# Patient Record
Sex: Male | Born: 1995 | Race: Black or African American | Hispanic: No | Marital: Single | Smoking: Current every day smoker
Health system: Southern US, Community
[De-identification: ages and names within clinical notes are randomized; demographics above are authoritative.]

## PROBLEM LIST (undated history)

## (undated) DIAGNOSIS — G40909 Epilepsy, unspecified, not intractable, without status epilepticus: Secondary | ICD-10-CM

## (undated) DIAGNOSIS — F909 Attention-deficit hyperactivity disorder, unspecified type: Secondary | ICD-10-CM

## (undated) DIAGNOSIS — G250 Essential tremor: Secondary | ICD-10-CM

## (undated) DIAGNOSIS — F32A Depression, unspecified: Secondary | ICD-10-CM

## (undated) DIAGNOSIS — F840 Autistic disorder: Secondary | ICD-10-CM

## (undated) DIAGNOSIS — R6250 Unspecified lack of expected normal physiological development in childhood: Secondary | ICD-10-CM

## (undated) DIAGNOSIS — F17200 Nicotine dependence, unspecified, uncomplicated: Secondary | ICD-10-CM

## (undated) DIAGNOSIS — J8410 Pulmonary fibrosis, unspecified: Secondary | ICD-10-CM

## (undated) DIAGNOSIS — R569 Unspecified convulsions: Secondary | ICD-10-CM

## (undated) DIAGNOSIS — F919 Conduct disorder, unspecified: Secondary | ICD-10-CM

## (undated) DIAGNOSIS — R4689 Other symptoms and signs involving appearance and behavior: Secondary | ICD-10-CM

## (undated) DIAGNOSIS — F329 Major depressive disorder, single episode, unspecified: Secondary | ICD-10-CM

## (undated) HISTORY — DX: Epilepsy, unspecified, not intractable, without status epilepticus: G40.909

## (undated) HISTORY — DX: Attention-deficit hyperactivity disorder, unspecified type: F90.9

## (undated) HISTORY — DX: Unspecified lack of expected normal physiological development in childhood: R62.50

## (undated) HISTORY — PX: NO PAST SURGERIES: SHX2092

## (undated) HISTORY — DX: Essential tremor: G25.0

## (undated) HISTORY — DX: Other symptoms and signs involving appearance and behavior: R46.89

## (undated) HISTORY — DX: Autistic disorder: F84.0

## (undated) HISTORY — DX: Conduct disorder, unspecified: F91.9

## (undated) HISTORY — DX: Unspecified convulsions: R56.9

## (undated) HISTORY — DX: Nicotine dependence, unspecified, uncomplicated: F17.200

## (undated) HISTORY — DX: Pulmonary fibrosis, unspecified: J84.10

---
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
## 2014-01-01 ENCOUNTER — Encounter (HOSPITAL_COMMUNITY): Payer: Self-pay | Admitting: Registered Nurse

## 2014-01-01 DIAGNOSIS — F913 Oppositional defiant disorder: Secondary | ICD-10-CM

## 2014-01-01 DIAGNOSIS — R45850 Homicidal ideations: Secondary | ICD-10-CM

## 2014-01-01 MED ORDER — CARBAMAZEPINE 200 MG PO TABS
300.0000 mg | ORAL_TABLET | Freq: Two times a day (BID) | ORAL | Status: DC
  Administered 2014-01-01 – 2014-01-02 (×3): 300 mg via ORAL
  Filled 2014-01-01 (×4): qty 1.5

## 2014-01-01 MED ORDER — ARIPIPRAZOLE 10 MG PO TABS
10.0000 mg | ORAL_TABLET | Freq: Every day | ORAL | Status: DC
  Administered 2014-01-01 – 2014-01-02 (×2): 10 mg via ORAL
  Filled 2014-01-01 (×2): qty 1

## 2014-01-01 MED ORDER — SERTRALINE HCL 50 MG PO TABS
100.0000 mg | ORAL_TABLET | Freq: Every day | ORAL | Status: DC
  Administered 2014-01-01 – 2014-01-02 (×2): 100 mg via ORAL
  Filled 2014-01-01 (×2): qty 2
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
## 2014-01-01 NOTE — BH Assessment (Signed)
Per Charter CommunicationsShuvon pt is to remain in the ER overnight for observation for possible d/c plans in the morning.  Glorious PeachNajah Ilynn Stauffer, MS, LCASA Assessment Counselor

## 2014-01-01 NOTE — ED Notes (Signed)
Pt resting in room, in no acute distress 

## 2014-01-01 NOTE — Progress Notes (Signed)
Spoke with EDP, Kohut about home meds/ ED meds

## 2014-01-02 ENCOUNTER — Encounter (HOSPITAL_COMMUNITY): Payer: Self-pay | Admitting: Registered Nurse

## 2014-01-02 NOTE — BHH Suicide Risk Assessment (Cosign Needed)
Suicide Risk Assessment  Discharge Assessment     Demographic Factors:  Male  Total Time spent with patient: 30 minutes Psychiatric Specialty Exam:      Blood pressure 119/58, pulse 68, temperature 98 F (36.7 C), temperature source Oral, resp. rate 18, height 6\' 2"  (1.88 m), weight 76.658 kg (169 lb), SpO2 100.00%.Body mass index is 21.69 kg/(m^2).   General Appearance: Disheveled   Eye SolicitorContact:: None   Speech: Clear and Coherent and Normal Rate   Volume: Decreased   Mood: Irritable   Affect: Flat   Thought Process: Circumstantial   Orientation: Full (Time, Place, and Person)   Thought Content: "She took my stuff"   Suicidal Thoughts: No   Homicidal Thoughts: No   Memory: Immediate; Fair  Recent; Fair  Remote; Fair   Judgement: Poor   Insight: Lacking and Shallow   Psychomotor Activity: Decreased   Concentration: Fair   Recall: Eastman KodakFair   Fund of Knowledge:Fair   Language: Fair   Akathisia: No   Handed: Right   AIMS (if indicated):   Assets: Desire for Improvement  Housing  Social Support   Sleep:   Musculoskeletal:  Strength & Muscle Tone: within normal limits  Gait & Station: normal  Patient leans: N/A    Mental Status Per Nursing Assessment::   On Admission:     Current Mental Status by Physician: Patient denies suicidal/homicidal ideation, psychosis, and paranoia  Loss Factors: NA  Historical Factors: NA  Risk Reduction Factors:   Positive social support  Continued Clinical Symptoms:  Previous Psychiatric Diagnoses and Treatments  Cognitive Features That Contribute To Risk:  Closed-mindedness    Suicide Risk:  Minimal: No identifiable suicidal ideation.  Patients presenting with no risk factors but with morbid ruminations; may be classified as minimal risk based on the severity of the depressive symptoms  Discharge Diagnoses:  AXIS I: Oppositional Defiant Disorder  AXIS II: Deferred  AXIS III:  Past Medical History   Diagnosis  Date   .   Seizures    .  Aggressive behavior    .  Conduct disorder    .  ADHD (attention deficit hyperactivity disorder)    .  Delay in development    .  Depression     AXIS IV: other psychosocial or environmental problems  AXIS V: 51-60 moderate symptoms   Plan Of Care/Follow-up recommendations:  Activity:  Resume usual activity Diet:  Resume usual diet  Is patient on multiple antipsychotic therapies at discharge:  No   Has Patient had three or more failed trials of antipsychotic monotherapy by history:  No  Recommended Plan for Multiple Antipsychotic Therapies: NA    Rankin, Shuvon, FNP-BC 01/02/2014, 3:46 PM

## 2014-01-02 NOTE — Discharge Instructions (Signed)
Aggression Physically aggressive behavior is common among small children. When frustrated or angry, toddlers may act out. Often, they will push, bite, or hit. Most children show less physical aggression as they grow up. Their language and interpersonal skills improve, too. But continued aggressive behavior is a sign of a problem. This behavior can lead to aggression and delinquency in adolescence and adulthood. Aggressive behavior can be psychological or physical. Forms of psychological aggression include threatening or bullying others. Forms of physical aggression include:  Pushing.  Hitting.  Slapping.  Kicking.  Stabbing.  Shooting.  Raping. PREVENTION  Encouraging the following behaviors can help manage aggression:  Respecting others and valuing differences.  Participating in school and community functions, including sports, music, after-school programs, community groups, and volunteer work.  Talking with an adult when they are sad, depressed, fearful, anxious, or angry. Discussions with a parent or other family member, Veterinary surgeon, Runner, broadcasting/film/video, or coach can help.  Avoiding alcohol and drug use.  Dealing with disagreements without aggression, such as conflict resolution. To learn this, children need parents and caregivers to model respectful communication and problem solving.  Limiting exposure to aggression and violence, such as video games that are not age appropriate, violence in the media, or domestic violence. Document Released: 04/05/2007 Document Revised: 08/31/2011 Document Reviewed: 08/14/2010 [facility] Patient Information 2015 [place], [place]. This information is not intended to replace advice given to you by your health care provider. Make sure you discuss any questions you have with your health care provider.  Conduct Disorder Conduct disorder is a chronic, repeated pattern of behavior that violates basic rights of others or societal rules.  CAUSES A clear cause for  conduct disorder has not been determined. However, there are both genetic and environmental risk factors for the development of conduct disorder, such as having a parent with antisocial personality disorder or alcohol dependence.  SYMPTOMS Symptoms of conduct disorder most often begin between middle childhood and middle adolescence and occur in multiple settings. Symptoms include:   Aggression toward people or animals.  Bullying, threatening, or intimidating behavior.  Starting physical fights or aggressive reactions to others.  Use of a weapon that can cause serious physical harm to others.  Destruction of property.  Unlawful entry into another's car or home.  Lying.  Stealing.  Running away from home for lengthy periods.  Skipping school. DIAGNOSIS Conduct disorder is diagnosed by the following:  Exam of the child alone and with a parent or caregiver.  Interview with the parents or caregiver alone.  Review of school reports.  A physical exam. Document Released: 09/23/2010 Document Revised: 08/31/2011 Document Reviewed: 09/23/2010 [facility] Patient Information 2015 [place], [place]. This information is not intended to replace advice given to you by your health care provider. Make sure you discuss any questions you have with your health care provider.  Oppositional Defiant Disorder  Oppositional defiant disorder (ODD) is a pattern of negative, defiant, and hostile behavior toward authority figures and often includes a tendency to bother and irritate others on purpose. Periods of oppositional behavior are common during preschool years and adolescence. Oppositional defiant disorder only can be diagnosed if these behaviors persist and cause significant impairment in social or academic functioning. Problems often begin in children before they reach the age of 8 years. Problem behaviors often start at home, but over time these behaviors may appear in other settings. There is often a  vicious cycle between a child's difficult temperament (hard to sooth, intense emotional reactions) and the parents' frustrated, negative or  harsh reactions. Oppositional defiant disorder tends to run in families. It also is more common when parents are experiencing marital problems. SYMPTOMS Symptoms of ODD include negative, hostile and defiant behavior that lasts at least 6 months. During these 6 months, 4 or more of the following behaviors are present:   Loss of temper.  Argumentative behavior toward adults.  Active refusal of adults' requests or rules.  Deliberately annoys people.  Refusal to accept blame for his or her mistakes or misbehavior.  Easily annoyed by others.  Angry and resentful.  Spiteful and vindictive behavior. DIAGNOSIS Oppositional defiant disorder is diagnosed in the same way as many other psychiatric disorders in children. This is done by:  Examining the child.  Talking with the child.  Talking to the parents.  Thoroughly reviewing the medical history. It is also common in the children with ODD to have other psychiatric problems.   Document Released: 11/28/2001 Document Revised: 08/31/2011 Document Reviewed: 09/29/2010 [facility] Patient Information 2015 [place]. This information is not intended to replace advice given to you by your health care provider. Make sure you discuss any questions you have with your health care provider.

## 2014-01-02 NOTE — Progress Notes (Signed)
CSW spoke with the patient's mother and explained the aftercare plan and she is in agreement.  Mother reports that she is on her way to pick the patient up for discharge.       Geoffrey [name], MSW, SpauldingLCSWA, 01/02/2014 Evening Clinical Social Worker [phone]

## 2014-01-02 NOTE — ED Notes (Signed)
Pt mother here to pick patient up for discharge; already spoke to CSW and MD; paper provided to mother; belongings, two bags, sent to patient; pt stable at time of discharge
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
## 2014-01-02 NOTE — ED Notes (Signed)
MD at bedside. 

## 2014-01-02 NOTE — Progress Notes (Signed)
CSW spoke with Olevia PerchesMegan Johnson [phone]) with [facility] to confirm upcoming appointments.  She suggested the CSW to contact the office to speak with Deonna at [phone].  CSW spoke with Samuel GermanyDeonna the patient next appointment is 01/04/14@11 :30am. CSW called [facility] to schedule appointment but was only able leave a message.      Maryelizabeth Rowanressa Floriene Jeschke, MSW, [place]CSWA, 01/02/2014 Evening Clinical Social Worker [phone]

## 2014-01-03 NOTE — ED Provider Notes (Signed)
Medical screening examination/treatment/procedure(s) were conducted as a shared visit with non-physician practitioner(s) and myself.  I personally evaluated the patient during the encounter.   EKG Interpretation None       Doug SouSam Yani Coventry, MD 01/03/14 [phone]

## 2014-01-25 ENCOUNTER — Encounter (HOSPITAL_COMMUNITY): Payer: Self-pay | Admitting: Emergency Medicine

## 2014-01-25 ENCOUNTER — Emergency Department (HOSPITAL_COMMUNITY)
Admission: EM | Admit: 2014-01-25 | Discharge: 2014-01-25 | Disposition: A | Discharge status: Home / Self Care | Payer: Medicaid Other | Attending: Emergency Medicine | Admitting: Emergency Medicine

## 2014-01-25 DIAGNOSIS — F4310 Post-traumatic stress disorder, unspecified: Secondary | ICD-10-CM

## 2014-01-25 DIAGNOSIS — F17200 Nicotine dependence, unspecified, uncomplicated: Secondary | ICD-10-CM | POA: Diagnosis not present

## 2014-01-25 DIAGNOSIS — F329 Major depressive disorder, single episode, unspecified: Secondary | ICD-10-CM | POA: Insufficient documentation

## 2014-01-25 DIAGNOSIS — F919 Conduct disorder, unspecified: Secondary | ICD-10-CM

## 2014-01-25 DIAGNOSIS — F911 Conduct disorder, childhood-onset type: Secondary | ICD-10-CM | POA: Insufficient documentation

## 2014-01-25 DIAGNOSIS — R45850 Homicidal ideations: Secondary | ICD-10-CM | POA: Insufficient documentation

## 2014-01-25 DIAGNOSIS — F988 Other specified behavioral and emotional disorders with onset usually occurring in childhood and adolescence: Secondary | ICD-10-CM | POA: Insufficient documentation

## 2014-01-25 DIAGNOSIS — R454 Irritability and anger: Secondary | ICD-10-CM

## 2014-01-25 DIAGNOSIS — G40909 Epilepsy, unspecified, not intractable, without status epilepticus: Secondary | ICD-10-CM | POA: Diagnosis not present

## 2014-01-25 DIAGNOSIS — F3289 Other specified depressive episodes: Secondary | ICD-10-CM | POA: Insufficient documentation

## 2014-01-25 DIAGNOSIS — Z79899 Other long term (current) drug therapy: Secondary | ICD-10-CM | POA: Insufficient documentation

## 2014-01-25 DIAGNOSIS — F913 Oppositional defiant disorder: Secondary | ICD-10-CM | POA: Diagnosis not present

## 2014-01-25 LAB — COMPREHENSIVE METABOLIC PANEL
ALT: 18 U/L (ref 0–53)
AST: 23 U/L (ref 0–37)
Albumin: 3.9 g/dL (ref 3.5–5.2)
Alkaline Phosphatase: 94 U/L (ref 39–117)
Anion gap: 10 (ref 5–15)
BUN: 14 mg/dL (ref 6–23)
CO2: 27 meq/L (ref 19–32)
Calcium: 9.6 mg/dL (ref 8.4–10.5)
Chloride: 105 mEq/L (ref 96–112)
Creatinine, Ser: 1.07 mg/dL (ref 0.50–1.35)
GFR calc Af Amer: >90 mL/min (ref >90)
GFR calc non Af Amer: >90 mL/min (ref >90)
Glucose, Bld: 100 mg/dL — ABNORMAL HIGH (ref 70–99)
POTASSIUM: 4 meq/L (ref 3.7–5.3)
SODIUM: 142 meq/L (ref 137–147)
TOTAL PROTEIN: 7.6 g/dL (ref 6.0–8.3)
Total Bilirubin: 0.3 mg/dL (ref 0.3–1.2)

## 2014-01-25 LAB — CBC
HEMATOCRIT: 38.9 % — AB (ref 39.0–52.0)
HEMOGLOBIN: 13.3 g/dL (ref 13.0–17.0)
MCH: 30.9 pg (ref 26.0–34.0)
MCHC: 34.2 g/dL (ref 30.0–36.0)
MCV: 90.5 fL (ref 78.0–100.0)
Platelets: 160 10*3/uL (ref 150–400)
RBC: 4.3 MIL/uL (ref 4.22–5.81)
RDW: 12.7 % (ref 11.5–15.5)
WBC: 4.1 10*3/uL (ref 4.0–10.5)

## 2014-01-25 LAB — ETHANOL: Alcohol, Ethyl (B): <11 mg/dL (ref 0–11)

## 2014-01-25 LAB — SALICYLATE LEVEL: Salicylate Lvl: <2 mg/dL — ABNORMAL LOW (ref 2.8–20.0)

## 2014-01-25 LAB — ACETAMINOPHEN LEVEL

## 2014-01-25 MED ORDER — ONDANSETRON HCL 4 MG PO TABS: 4.0000 mg | ORAL_TABLET | Freq: Three times a day (TID) | ORAL | Status: DC | PRN

## 2014-01-25 MED ORDER — ACETAMINOPHEN 325 MG PO TABS: 650.0000 mg | ORAL_TABLET | ORAL | Status: DC | PRN

## 2014-01-25 MED ORDER — ZOLPIDEM TARTRATE 5 MG PO TABS: 5.0000 mg | ORAL_TABLET | Freq: Every evening | ORAL | Status: DC | PRN

## 2014-01-25 MED ORDER — IBUPROFEN 200 MG PO TABS: 600.0000 mg | ORAL_TABLET | Freq: Three times a day (TID) | ORAL | Status: DC | PRN

## 2014-01-25 MED ORDER — LORAZEPAM 1 MG PO TABS
1.0000 mg | ORAL_TABLET | Freq: Three times a day (TID) | ORAL | Status: DC | PRN
Start: 2014-01-25 — End: 2014-01-25

## 2014-01-25 MED ORDER — NICOTINE 21 MG/24HR TD PT24: 21.0000 mg | MEDICATED_PATCH | Freq: Every day | TRANSDERMAL | Status: DC

## 2014-01-25 MED ORDER — ALUM & MAG HYDROXIDE-SIMETH 200-200-20 MG/5ML PO SUSP: 30.0000 mL | ORAL | Status: DC | PRN

## 2014-01-25 NOTE — ED Notes (Signed)
Patient has one bag of belongings in locker 32. 

## 2014-01-25 NOTE — ED Notes (Addendum)
Pt's mother, Feliciana RossettiRoshanna Williams [phone]), called to ask when she would be coming to pick him up.  Pt's mother states that we need to keep him and she is talking to his crisis team to be able to force us to keep him.  I told her that pt has already been discharged and that we would not be keeping him.  Mother states that she will be here to pick him up soon.

## 2014-01-25 NOTE — ED Notes (Signed)
Mom has arrived to bedside and requesting to speak to psych MD.  States patient is not going home.  Called [employer].  Notified to tell Mom, Dr. Ladona Ridgelaylor has left for the day.  Notified Charge RN to evaluate for next step.

## 2014-01-25 NOTE — Consult Note (Signed)
  Mother of patient was refusing to take patient home.  Met with mother and discussed patient behavioral issues.  Mother states that patient refuses to listen, gets up set when he is told to do his chores.  "I have been trying to get him in a group home and [facility] tells me that they are still waiting on information from the [place] worker called [facility] and got information for mother.  Mother states that an appointment was set for some one to come out to house.  Mother also given information and resources for group homes.  Informed mother that patient was not suicidal, homicidal, psychotic, or paranoid to the point where he would hurt him self or anyone else.  Informed that we could not send him to hospital for behavioral problems.  Mother did voice her agreement.     Johnice Riebe B. Josede Cicero FNP-BC
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
## 2014-01-25 NOTE — ED Notes (Signed)
Notified family of pt d/c.  Pt mother had concerns and requested to speak to MD.  Pt call transferred to Dr. Ladona Ridgelaylor.
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
## 2014-01-25 NOTE — Discharge Instructions (Signed)
Aggression Physically aggressive behavior is common among small children. When frustrated or angry, toddlers may act out. Often, they will push, bite, or hit. Most children show less physical aggression as they grow up. Their language and interpersonal skills improve, too. But continued aggressive behavior is a sign of a problem. This behavior can lead to aggression and delinquency in adolescence and adulthood. Aggressive behavior can be psychological or physical. Forms of psychological aggression include threatening or bullying others. Forms of physical aggression include:  Pushing.  Hitting.  Slapping.  Kicking.  Stabbing.  Shooting.  Raping. PREVENTION  Encouraging the following behaviors can help manage aggression:  Respecting others and valuing differences.  Participating in school and community functions, including sports, music, after-school programs, community groups, and volunteer work.  Talking with an adult when they are sad, depressed, fearful, anxious, or angry. Discussions with a parent or other family member, Veterinary surgeoncounselor, Runner, broadcasting/film/videoteacher, or coach can help.  Avoiding alcohol and drug use.  Dealing with disagreements without aggression, such as conflict resolution. To learn this, children need parents and caregivers to model respectful communication and problem solving.  Limiting exposure to aggression and violence, such as video games that are not age appropriate, violence in the media, or domestic violence. Document Released: 04/05/2007 Document Revised: 08/31/2011 Document Reviewed: 08/14/2010 [facility] Patient Information 2015 [employer], [place]. This information is not intended to replace advice given to you by your health care provider. Make sure you discuss any questions you have with your health care provider.  Conduct Disorder Conduct disorder is a chronic, repeated pattern of behavior that violates basic rights of others or societal rules.  CAUSES A clear cause for  conduct disorder has not been determined. However, there are both genetic and environmental risk factors for the development of conduct disorder, such as having a parent with antisocial personality disorder or alcohol dependence.  SYMPTOMS Symptoms of conduct disorder most often begin between middle childhood and middle adolescence and occur in multiple settings. Symptoms include:   Aggression toward people or animals.  Bullying, threatening, or intimidating behavior.  Starting physical fights or aggressive reactions to others.  Use of a weapon that can cause serious physical harm to others.  Destruction of property.  Unlawful entry into another's car or home.  Lying.  Stealing.  Running away from home for lengthy periods.  Skipping school. DIAGNOSIS Conduct disorder is diagnosed by the following:  Exam of the child alone and with a parent or caregiver.  Interview with the parents or caregiver alone.  Review of school reports.  A physical exam. Document Released: 09/23/2010 Document Revised: 08/31/2011 Document Reviewed: 09/23/2010 [facility] Patient Information 2015 [place], [place]. This information is not intended to replace advice given to you by your health care provider. Make sure you discuss any questions you have with your health care provider.

## 2014-01-25 NOTE — [facility] Suicide Risk Assessment (Cosign Needed)
Suicide Risk Assessment  Discharge Assessment     Demographic Factors:  Male  Total Time spent with patient: 20 minutes  Psychiatric Specialty Exam:     Blood pressure 98/60, pulse 78, temperature 98.5 F (36.9 C), temperature source Oral, resp. rate 16, SpO2 100.00%.There is no weight on file to calculate BMI.   General Appearance: Casual   Eye Contact:: Good   Speech: Clear and Coherent and Normal Rate   Volume: Normal   Mood: Good"   Affect: Appropriate and Congruent   Thought Process: Circumstantial   Orientation: Full (Time, Place, and Person)   Thought Content: "I just didn't want him in my room; I didn't hit him"   Suicidal Thoughts: No   Homicidal Thoughts: No   Memory: Immediate; Good  Recent; Good  Remote; Good   Judgement: Fair   Insight: Present   Psychomotor Activity: Normal   Concentration: Fair   Recall: Good   Fund of Knowledge:Fair   Language: Fair   Akathisia: No   Handed: Right   AIMS (if indicated):   Assets: Communication Skills  Desire for Improvement  Housing  Social Support   Sleep:   Musculoskeletal:  Strength & Muscle Tone: within normal limits  Gait & Station: normal  Patient leans: N/A  Mental Status Per Nursing Assessment::   On Admission:     Current Mental Status by Physician: Patient denies suicidal/homicidal ideation, psychosis, and paranoia  Loss Factors: NA  Historical Factors: NA  Risk Reduction Factors:   Living with another person, especially a relative and Positive social support  Continued Clinical Symptoms:  Previous Psychiatric Diagnoses and Treatments  Cognitive Features That Contribute To Risk:  Loss of executive function    Suicide Risk:  Minimal: No identifiable suicidal ideation.  Patients presenting with no risk factors but with morbid ruminations; may be classified as minimal risk based on the severity of the depressive symptoms  Discharge Diagnoses:   AXIS I: ADHD, hyperactive type, Conduct  Disorder, Oppositional Defiant Disorder and Post Traumatic Stress Disorder  AXIS II: Deferred  AXIS III:  Past Medical History   Diagnosis  Date   .  Seizures    .  Aggressive behavior    .  Conduct disorder    .  ADHD (attention deficit hyperactivity disorder)    .  Delay in development    .  Depression     AXIS IV: other psychosocial or environmental problems  AXIS V: 61-70 mild symptoms     Plan Of Care/Follow-up recommendations:  Activity:  As tolerated Diet:  As tolerated  Is patient on multiple antipsychotic therapies at discharge:  No   Has Patient had three or more failed trials of antipsychotic monotherapy by history:  No  Recommended Plan for Multiple Antipsychotic Therapies: NA    Areya Lemmerman, FNP-BC 01/25/2014, 2:16 PM
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
## 2014-01-25 NOTE — Consult Note (Signed)
Face to face evaluation and I agree with this note 

## 2014-01-26 NOTE — ED Provider Notes (Signed)
Medical screening examination/treatment/procedure(s) were performed by non-physician practitioner and as supervising physician I was immediately available for consultation/collaboration.   EKG Interpretation None         Rolland PorterMark Elynn Patteson, MD 01/26/14 [phone]

## 2014-01-30 NOTE — Consult Note (Signed)
Face to face evaluation and I agree with this note 

## 2014-10-01 ENCOUNTER — Encounter (HOSPITAL_COMMUNITY): Payer: Self-pay | Admitting: *Deleted

## 2014-10-01 ENCOUNTER — Emergency Department (HOSPITAL_COMMUNITY)
Admission: EM | Admit: 2014-10-01 | Discharge: 2014-10-05 | Disposition: A | Discharge status: Home / Self Care | Payer: Medicaid Other | Attending: Emergency Medicine | Admitting: Emergency Medicine

## 2014-10-01 DIAGNOSIS — F988 Other specified behavioral and emotional disorders with onset usually occurring in childhood and adolescence: Secondary | ICD-10-CM | POA: Diagnosis present

## 2014-10-01 DIAGNOSIS — F329 Major depressive disorder, single episode, unspecified: Secondary | ICD-10-CM | POA: Insufficient documentation

## 2014-10-01 DIAGNOSIS — F840 Autistic disorder: Secondary | ICD-10-CM | POA: Diagnosis present

## 2014-10-01 DIAGNOSIS — F919 Conduct disorder, unspecified: Secondary | ICD-10-CM | POA: Insufficient documentation

## 2014-10-01 DIAGNOSIS — F6089 Other specific personality disorders: Secondary | ICD-10-CM | POA: Diagnosis not present

## 2014-10-01 DIAGNOSIS — Z79899 Other long term (current) drug therapy: Secondary | ICD-10-CM | POA: Diagnosis not present

## 2014-10-01 DIAGNOSIS — Z046 Encounter for general psychiatric examination, requested by authority: Secondary | ICD-10-CM | POA: Diagnosis present

## 2014-10-01 DIAGNOSIS — G40909 Epilepsy, unspecified, not intractable, without status epilepticus: Secondary | ICD-10-CM | POA: Diagnosis not present

## 2014-10-01 DIAGNOSIS — Z720 Tobacco use: Secondary | ICD-10-CM | POA: Insufficient documentation

## 2014-10-01 DIAGNOSIS — R4689 Other symptoms and signs involving appearance and behavior: Secondary | ICD-10-CM

## 2014-10-01 DIAGNOSIS — F4310 Post-traumatic stress disorder, unspecified: Secondary | ICD-10-CM | POA: Diagnosis present

## 2014-10-01 DIAGNOSIS — R454 Irritability and anger: Secondary | ICD-10-CM | POA: Diagnosis present

## 2014-10-01 LAB — COMPREHENSIVE METABOLIC PANEL
ALBUMIN: 4.4 g/dL (ref 3.5–5.2)
ALT: 17 U/L (ref 0–53)
ANION GAP: 8 (ref 5–15)
AST: 33 U/L (ref 0–37)
Alkaline Phosphatase: 67 U/L (ref 39–117)
BUN: 23 mg/dL (ref 6–23)
CO2: 25 mmol/L (ref 19–32)
Calcium: 9.1 mg/dL (ref 8.4–10.5)
Chloride: 106 mmol/L (ref 96–112)
Creatinine, Ser: 0.97 mg/dL (ref 0.50–1.35)
GFR calc Af Amer: >90 mL/min (ref >90)
Glucose, Bld: 93 mg/dL (ref 70–99)
Potassium: 4.4 mmol/L (ref 3.5–5.1)
Sodium: 139 mmol/L (ref 135–145)
Total Bilirubin: 0.6 mg/dL (ref 0.3–1.2)
Total Protein: 8.4 g/dL — ABNORMAL HIGH (ref 6.0–8.3)

## 2014-10-01 LAB — ACETAMINOPHEN LEVEL: Acetaminophen (Tylenol), Serum: <10 ug/mL — ABNORMAL LOW (ref 10–30)

## 2014-10-01 LAB — CBC
HEMATOCRIT: 42.2 % (ref 39.0–52.0)
HEMOGLOBIN: 14.5 g/dL (ref 13.0–17.0)
MCH: 31.1 pg (ref 26.0–34.0)
MCHC: 34.4 g/dL (ref 30.0–36.0)
MCV: 90.6 fL (ref 78.0–100.0)
PLATELETS: 236 10*3/uL (ref 150–400)
RBC: 4.66 MIL/uL (ref 4.22–5.81)
RDW: 13 % (ref 11.5–15.5)
WBC: 7.3 10*3/uL (ref 4.0–10.5)

## 2014-10-01 LAB — SALICYLATE LEVEL: Salicylate Lvl: <4 mg/dL (ref 2.8–20.0)

## 2014-10-01 LAB — ETHANOL: Alcohol, Ethyl (B): <5 mg/dL (ref 0–9)
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
## 2014-10-02 DIAGNOSIS — F840 Autistic disorder: Secondary | ICD-10-CM | POA: Diagnosis present

## 2014-10-02 DIAGNOSIS — F6089 Other specific personality disorders: Secondary | ICD-10-CM

## 2014-10-02 DIAGNOSIS — R4689 Other symptoms and signs involving appearance and behavior: Secondary | ICD-10-CM | POA: Insufficient documentation

## 2014-10-02 LAB — RAPID URINE DRUG SCREEN, HOSP PERFORMED
Amphetamines: NOT DETECTED
BARBITURATES: NOT DETECTED
BENZODIAZEPINES: NOT DETECTED
COCAINE: NOT DETECTED
Opiates: NOT DETECTED
TETRAHYDROCANNABINOL: NOT DETECTED

## 2014-10-02 MED ORDER — NICOTINE 21 MG/24HR TD PT24
21.0000 mg | MEDICATED_PATCH | Freq: Once | TRANSDERMAL | Status: DC
Start: 2014-10-02 — End: 2014-10-05

## 2014-10-02 MED ORDER — ARIPIPRAZOLE 10 MG PO TABS
10.0000 mg | ORAL_TABLET | Freq: Every day | ORAL | Status: DC
  Administered 2014-10-02 – 2014-10-05 (×4): 10 mg via ORAL
  Filled 2014-10-02 (×5): qty 1

## 2014-10-02 MED ORDER — ACETAMINOPHEN 325 MG PO TABS: 650.0000 mg | ORAL_TABLET | ORAL | Status: DC | PRN

## 2014-10-02 MED ORDER — IBUPROFEN 200 MG PO TABS: 600.0000 mg | ORAL_TABLET | Freq: Three times a day (TID) | ORAL | Status: DC | PRN

## 2014-10-02 MED ORDER — LORAZEPAM 1 MG PO TABS: 1.0000 mg | ORAL_TABLET | Freq: Four times a day (QID) | ORAL | Status: DC | PRN

## 2014-10-02 MED ORDER — CARBAMAZEPINE 200 MG PO TABS
300.0000 mg | ORAL_TABLET | Freq: Two times a day (BID) | ORAL | Status: DC
Start: 2014-10-02 — End: 2014-10-05
  Administered 2014-10-02 – 2014-10-04 (×6): 300 mg via ORAL
  Administered 2014-10-05: 10:00:00 via ORAL
  Filled 2014-10-02 (×9): qty 1.5

## 2014-10-02 MED ORDER — SERTRALINE HCL 50 MG PO TABS
100.0000 mg | ORAL_TABLET | Freq: Every day | ORAL | Status: DC
  Administered 2014-10-02 – 2014-10-05 (×4): 100 mg via ORAL
  Filled 2014-10-02 (×4): qty 2

## 2014-10-02 MED ORDER — ONDANSETRON HCL 4 MG PO TABS: 4.0000 mg | ORAL_TABLET | Freq: Three times a day (TID) | ORAL | Status: DC | PRN

## 2014-10-02 MED ORDER — CLONIDINE HCL 0.1 MG PO TABS
0.1000 mg | ORAL_TABLET | Freq: Two times a day (BID) | ORAL | Status: DC
  Administered 2014-10-02 – 2014-10-05 (×6): 0.1 mg via ORAL
  Filled 2014-10-02 (×6): qty 1

## 2014-10-02 NOTE — ED Notes (Signed)
Calm and cooperative.  Acuity low. 

## 2014-10-02 NOTE — Progress Notes (Signed)
Per chart review, pt mother is guardian. [facility] [employer] in the past. Per chart review from visit in 06/2013, patient IEP has patient in school until the age of 19. Pt has history of violent outburst. Pt mother was interested in group home placement in the past, but then decided to have pt remain in the home. [facility] and [facility], transition team. CSW to follow up with pt mother/guardian to obtain further collateral and other psychosocial/environmental factors for this admission.   Olga CoasterKristen Drayden Lukas, LCSW  Clinical Social Work  [facility] [phone]
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
## 2014-10-02 NOTE — ED Notes (Signed)
[facility] paper documentation in shadow chart.  Patient cal and cooperative.  Acuity low-moderate due to MR.
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
## 2014-10-02 NOTE — BH Assessment (Addendum)
Assessment completed. Attempted to contact petitioner Geoffrey [name]([name]-pt's mother) to gather collateral information; however petitioner did not answer and voice message was left for a return phone call. Consulted Donell SievertSpencer Simon, PA-C who recommended that pt be evaluated by psychiatry in the am for possible discharge. Dr. Blinda LeatherwoodPollina has been informed of the recommendation.

## 2014-10-02 NOTE — Progress Notes (Addendum)
Pt mother stated that Psychiatrist from [facility] coming to [facility] in efforts to assist with patient in level IV. Per mother, pt psychiatrist, to complete an updated psychological.   Olga CoasterKristen Demontrae Gilbert, LCSW  Clinical Social Work  [facility] [phone]

## 2014-10-02 NOTE — Progress Notes (Addendum)
TTS Counselor faxed over support documents for this pt to [facility]. This information is also reflected on the shift report.

## 2014-10-03 NOTE — ED Notes (Signed)
Patient is flat. Minimal communication. In bed covered. Denies SI, HI, AVH. Denies any anxiety or depression.   Encouragement offered. Snack provided.  Q 15 safety checks in place.

## 2014-10-03 NOTE — ED Notes (Signed)
Patient resting in bed. Denies SI, HI, AVH. Denies feelings of anxiety and depression. Refuses to have vitals taken at this time.   Encouragement offered.  Q 15 safety checks continue.
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
## 2014-10-04 DIAGNOSIS — F6089 Other specific personality disorders: Secondary | ICD-10-CM | POA: Diagnosis not present

## 2014-10-04 NOTE — ED Notes (Signed)
Acuity low. 
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
## 2014-10-04 NOTE — Progress Notes (Signed)
Pt evaluated by psychologist from [facility], and expressed that patient has declined. Patient mother states that patient is needing placement. Pt care coordinator Mikey CollegeKelvin is actively working on placement for patient in [facility] however it is not going to be readily available. Pt continues to present calm and cooperative. Pt does not meet criteria for [facility] diversion. [facility] does not have any beds available and respite is not available. Due to patient age and lack of innovation funding options are limited. CSW and pt mother discussed that if patient continues to remain out of crisis without thoughts of hurting self or others pt may be discharged home or to a level III group home. Pt mother requested time to discuss with pt step dad regarding home vs group home placement and does not wish for any calls to be made to group homes until tomorrow. Discussed with NP who is in agreement with plan to follow up with mother tomorrow regarding disposition.    Olga CoasterKristen Anastaisa Wooding, LCSW  Clinical Social Work  [facility] [phone]

## 2014-10-04 NOTE — ED Notes (Signed)
Calm and cooperative.  Acuity low. 

## 2014-10-04 NOTE — ED Notes (Signed)
Patient denies complaint. Spent entire evening in bed. Minimal communication with Clinical research associatewriter. No SI, HI, AVH at present.   Encouragement offered. Turned TV on for the patient.  Q 15 safety checks continue.

## 2014-10-05 MED ORDER — CARBAMAZEPINE 200 MG PO TABS: 300.0000 mg | ORAL_TABLET | Freq: Two times a day (BID) | ORAL | Status: DC

## 2014-10-05 MED ORDER — CLONIDINE HCL 0.1 MG PO TABS: 0.1000 mg | ORAL_TABLET | Freq: Two times a day (BID) | ORAL | Status: DC

## 2014-10-05 MED ORDER — ARIPIPRAZOLE 10 MG PO TABS: 10.0000 mg | ORAL_TABLET | Freq: Every day | ORAL | Status: DC

## 2014-10-05 MED ORDER — SERTRALINE HCL 100 MG PO TABS: 100.0000 mg | ORAL_TABLET | Freq: Every day | ORAL | Status: DC

## 2014-10-05 NOTE — Progress Notes (Addendum)
Per psychiatrist and NP, patient is psychiatrically stable for discharge home. Pt mother to look at group homes with family and follow up with care coordinator Laneta SimmersKelvin McCrawy [phone]. Patient mother to pick up patient later this afternoon once step dad is finished with job this afternoon.   Olga CoasterKristen Yarethzi Branan, LCSW  Clinical Social Work  [facility] [phone]   Pt mother plans to pick up patient at 4pm. Pt plans to [facility] with [facility].

## 2014-10-05 NOTE — [facility] Suicide Risk Assessment (Cosign Needed)
Suicide Risk Assessment  Discharge Assessment   [facility]   Demographic Factors:  Male, Adolescent or young adult, Caucasian, Low socioeconomic status and Unemployed  Total Time spent with patient: 30 minutes  Musculoskeletal: Strength & Muscle Tone: within normal limits Gait & Station: normal Patient leans: N/A  Psychiatric Specialty Exam:     Blood pressure 94/47, pulse 83, temperature 98.3 F (36.8 C), temperature source Oral, resp. rate 16, SpO2 99 %.There is no weight on file to calculate BMI.  General Appearance: Casual  Eye Contact::  Good  Speech:  Clear and Coherent and Slow409  Volume:  Decreased  Mood:  Anxious  Affect:  Blunt  Thought Process:  Coherent  Orientation:  Full (Time, Place, and Person)  Thought Content:  WDL  Suicidal Thoughts:  No  Homicidal Thoughts:  No  Memory:  Immediate;   Fair Recent;   Fair Remote;   Fair  Judgement:  Fair  Insight:  Fair  Psychomotor Activity:  Normal  Concentration:  Fair  Recall:  NA  Fund of Knowledge:Fair  Language: Fair  Akathisia:  NA  Handed:  Right  AIMS (if indicated):     Assets:  Desire for Improvement  Sleep:     Cognition: Impaired,  Mild  ADL's:  Intact      Has this patient used any form of tobacco in the last 30 days? (Cigarettes, Smokeless Tobacco, Cigars, and/or Pipes) N/A  Mental Status Per Nursing Assessment::   On Admission:     Current Mental Status by Physician: NA  Loss Factors: NA  Historical Factors: NA  Risk Reduction Factors:   Living with another person, especially a relative  Continued Clinical Symptoms:  Depression:   Insomnia  Cognitive Features That Contribute To Risk:  Polarized thinking    Suicide Risk:  Minimal: No identifiable suicidal ideation.  Patients presenting with no risk factors but with morbid ruminations; may be classified as minimal risk based on the severity of the depressive symptoms  Principal Problem:  Aggression Discharge Diagnoses:  Patient Active Problem List   Diagnosis Date Noted  . Aggression [F60.89] 10/02/2014  . Autism [F84.0] 10/02/2014  . Aggressive behavior [F60.89]   . 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 Post traumatic stress disorder (PTSD) [F43.10] 05/20/2013  . Excessive anger [F91.1] 05/20/2013  . ADD (attention deficit disorder) [F90.9] 05/20/2013  . Vitamin D insufficiency [E55.9] 12/30/2012  . Loss of weight [R63.4] 12/30/2012  . Abnormal weight loss [R63.4] 12/21/2012  . Intellectual disability [F79] 11/03/2012  . ADHD (attention deficit hyperactivity disorder), combined type [F90.2] 11/03/2012  . 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 Environmental allergies [Z91.048] 11/03/2012  . 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 Other convulsions [R56.9] 10/05/2012  . Essential and other specified forms of tremor [R25.1] 10/05/2012  . Unspecified mental or behavioral problem [F69] 10/05/2012  . 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   [facility]   Contact information:   [address] [place] [address] [phone]       Call to follow up.   Contact information:   Feliciana RossettiKelvin McCray  [phone]      Plan Of Care/Follow-up recommendations:  Activity:  as tolerated Diet:  regular  Is patient on multiple antipsychotic therapies at discharge:  No   Has Patient  had three or more failed trials of antipsychotic monotherapy by history:  No  Recommended Plan for Multiple Antipsychotic Therapies: NA    Geneva Pallas C   PMHNP-BC 10/05/2014, 3:40 PM
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
## 2014-10-05 NOTE — ED Notes (Signed)
Acuity low. 

## 2014-10-06 ENCOUNTER — Emergency Department (HOSPITAL_COMMUNITY)
Admission: EM | Admit: 2014-10-06 | Discharge: 2014-10-07 | Disposition: A | Discharge status: Home / Self Care | Payer: Medicaid Other | Attending: Emergency Medicine | Admitting: Emergency Medicine

## 2014-10-06 ENCOUNTER — Encounter (HOSPITAL_COMMUNITY): Payer: Self-pay | Admitting: Emergency Medicine

## 2014-10-06 DIAGNOSIS — F329 Major depressive disorder, single episode, unspecified: Secondary | ICD-10-CM | POA: Diagnosis not present

## 2014-10-06 DIAGNOSIS — Z79899 Other long term (current) drug therapy: Secondary | ICD-10-CM | POA: Diagnosis not present

## 2014-10-06 DIAGNOSIS — R4689 Other symptoms and signs involving appearance and behavior: Secondary | ICD-10-CM

## 2014-10-06 DIAGNOSIS — Z046 Encounter for general psychiatric examination, requested by authority: Secondary | ICD-10-CM | POA: Insufficient documentation

## 2014-10-06 DIAGNOSIS — Z720 Tobacco use: Secondary | ICD-10-CM | POA: Diagnosis not present

## 2014-10-06 DIAGNOSIS — F909 Attention-deficit hyperactivity disorder, unspecified type: Secondary | ICD-10-CM | POA: Diagnosis not present

## 2014-10-06 DIAGNOSIS — F911 Conduct disorder, childhood-onset type: Secondary | ICD-10-CM | POA: Insufficient documentation

## 2014-10-06 DIAGNOSIS — G40909 Epilepsy, unspecified, not intractable, without status epilepticus: Secondary | ICD-10-CM | POA: Insufficient documentation

## 2014-10-06 DIAGNOSIS — F6089 Other specific personality disorders: Secondary | ICD-10-CM | POA: Diagnosis not present

## 2014-10-06 LAB — CBC WITH DIFFERENTIAL/PLATELET
BASOS PCT: 0 % (ref 0–1)
Basophils Absolute: 0 10*3/uL (ref 0.0–0.1)
EOS ABS: 0 10*3/uL (ref 0.0–0.7)
Eosinophils Relative: 0 % (ref 0–5)
HEMATOCRIT: 44.4 % (ref 39.0–52.0)
Hemoglobin: 15.2 g/dL (ref 13.0–17.0)
LYMPHS PCT: 24 % (ref 12–46)
Lymphs Abs: 1.7 10*3/uL (ref 0.7–4.0)
MCH: 31.3 pg (ref 26.0–34.0)
MCHC: 34.2 g/dL (ref 30.0–36.0)
MCV: 91.4 fL (ref 78.0–100.0)
Monocytes Absolute: 0.5 10*3/uL (ref 0.1–1.0)
Monocytes Relative: 7 % (ref 3–12)
NEUTROS ABS: 5.1 10*3/uL (ref 1.7–7.7)
Neutrophils Relative %: 69 % (ref 43–77)
Platelets: 187 10*3/uL (ref 150–400)
RBC: 4.86 MIL/uL (ref 4.22–5.81)
RDW: 12.6 % (ref 11.5–15.5)
WBC: 7.3 10*3/uL (ref 4.0–10.5)

## 2014-10-06 LAB — COMPREHENSIVE METABOLIC PANEL
ALK PHOS: 69 U/L (ref 39–117)
ALT: 18 U/L (ref 0–53)
AST: 25 U/L (ref 0–37)
Albumin: 4.5 g/dL (ref 3.5–5.2)
Anion gap: 7 (ref 5–15)
BUN: 13 mg/dL (ref 6–23)
CHLORIDE: 103 mmol/L (ref 96–112)
CO2: 29 mmol/L (ref 19–32)
Calcium: 9.5 mg/dL (ref 8.4–10.5)
Creatinine, Ser: 0.94 mg/dL (ref 0.50–1.35)
GFR calc Af Amer: >90 mL/min (ref >90)
GFR calc non Af Amer: >90 mL/min (ref >90)
Glucose, Bld: 97 mg/dL (ref 70–99)
Potassium: 4.3 mmol/L (ref 3.5–5.1)
SODIUM: 139 mmol/L (ref 135–145)
TOTAL PROTEIN: 8.5 g/dL — AB (ref 6.0–8.3)
Total Bilirubin: 0.5 mg/dL (ref 0.3–1.2)

## 2014-10-06 LAB — RAPID URINE DRUG SCREEN, HOSP PERFORMED
Amphetamines: NOT DETECTED
BARBITURATES: NOT DETECTED
BENZODIAZEPINES: NOT DETECTED
Cocaine: NOT DETECTED
OPIATES: NOT DETECTED
TETRAHYDROCANNABINOL: NOT DETECTED

## 2014-10-06 LAB — ETHANOL: Alcohol, Ethyl (B): <5 mg/dL (ref 0–9)

## 2014-10-06 MED ORDER — METHYLPHENIDATE HCL ER 18 MG PO TB24
54.0000 mg | ORAL_TABLET | Freq: Every day | ORAL | Status: DC
  Administered 2014-10-07: 54 mg via ORAL
  Filled 2014-10-06: qty 3

## 2014-10-06 MED ORDER — SERTRALINE HCL 50 MG PO TABS
100.0000 mg | ORAL_TABLET | Freq: Every day | ORAL | Status: DC
  Administered 2014-10-06 – 2014-10-07 (×2): 100 mg via ORAL
  Filled 2014-10-06 (×2): qty 2

## 2014-10-06 MED ORDER — ALUM & MAG HYDROXIDE-SIMETH 200-200-20 MG/5ML PO SUSP: 30.0000 mL | ORAL | Status: DC | PRN

## 2014-10-06 MED ORDER — ACETAMINOPHEN 325 MG PO TABS: 650.0000 mg | ORAL_TABLET | ORAL | Status: DC | PRN

## 2014-10-06 MED ORDER — ONDANSETRON HCL 4 MG PO TABS: 4.0000 mg | ORAL_TABLET | Freq: Three times a day (TID) | ORAL | Status: DC | PRN

## 2014-10-06 MED ORDER — CLONIDINE HCL 0.1 MG PO TABS
0.1000 mg | ORAL_TABLET | Freq: Two times a day (BID) | ORAL | Status: DC
  Administered 2014-10-06 – 2014-10-07 (×3): 0.1 mg via ORAL
  Filled 2014-10-06 (×3): qty 1

## 2014-10-06 MED ORDER — CARBAMAZEPINE 200 MG PO TABS
300.0000 mg | ORAL_TABLET | Freq: Two times a day (BID) | ORAL | Status: DC
  Administered 2014-10-06 – 2014-10-07 (×2): 300 mg via ORAL
  Filled 2014-10-06 (×4): qty 1.5

## 2014-10-06 MED ORDER — ARIPIPRAZOLE 10 MG PO TABS
10.0000 mg | ORAL_TABLET | Freq: Every day | ORAL | Status: DC
  Administered 2014-10-06 – 2014-10-07 (×2): 10 mg via ORAL
  Filled 2014-10-06 (×2): qty 1

## 2014-10-06 MED ORDER — METHYLPHENIDATE HCL ER 18 MG PO TB24: ORAL_TABLET | Freq: Every day | ORAL | Status: DC

## 2014-10-06 MED ORDER — IBUPROFEN 200 MG PO TABS: 600.0000 mg | ORAL_TABLET | Freq: Three times a day (TID) | ORAL | Status: DC | PRN

## 2014-10-06 NOTE — ED Notes (Signed)
Pt's mother Geoffrey [name] called she would like to speak with the doctor or Provider about patient's care she can be reached at [phone]

## 2014-10-06 NOTE — ED Notes (Addendum)
Attempting to call report to SAPU, [facility]. Janie checking appropriateness of care for patient in SAPU unit.
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
## 2014-10-06 NOTE — ED Notes (Addendum)
Pt's mother speaking [place] Falls[place]bigail PA about situation. She was updated about patient situation and that the patient will stay overnight and be evaluated by the psychiatrist in the a.m. Mother is requesting patient to be placed, she is concerned about continued aggression at home and is feeling unsafe. Mother reports "he should've never been discharged yesterday".

## 2014-10-06 NOTE — ED Notes (Signed)
Patient and belongings were wanded 

## 2014-10-06 NOTE — ED Notes (Signed)
Pt. Alert and oriented in no distress denies SI, HI, AVH and pain.  Pt. Instructed to come to me with problems or concerns.Will continue to monitor for safety via security cameras and Q 15 minute checks. 

## 2014-10-06 NOTE — ED Notes (Signed)
Pt. Noted sleeping in room. No complaints or concerns voiced. No distress or abnormal behavior noted. Will continue to monitor with security cameras. Q 15 minute rounds continue. 
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
## 2014-10-06 NOTE — ED Notes (Signed)
Report received from Janie Rambo RN. Pt. Sleeping, respirations regular and unlabored. Will continue to monitor for safety via security cameras and Q 15 minute checks. 

## 2014-10-06 NOTE — ED Notes (Signed)
Attempted to call report to SAPU. They request we wait to 15-20 min before sending him due to receiving two other patients, will call back.
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
## 2014-10-07 DIAGNOSIS — Z046 Encounter for general psychiatric examination, requested by authority: Secondary | ICD-10-CM | POA: Diagnosis not present

## 2014-10-07 DIAGNOSIS — F6089 Other specific personality disorders: Secondary | ICD-10-CM
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
## 2014-10-07 NOTE — ED Notes (Signed)
Pt. Noted sleeping in room. No complaints or concerns voiced. No distress or abnormal behavior noted. Will continue to monitor with security cameras. Q 15 minute rounds continue. 

## 2014-10-07 NOTE — Progress Notes (Signed)
11:16am. CSW spoke with pt's mother/guardian after mother/guardian had spoken with psychiatrist. CSW and mother/guardian reviewed what MD said--that pt is not meeting criteria for inpatient hospitalization as he is not SI, HI, psychotic or presenting in crisis. Pt is calm and cooperative with hospital staff. CSW and mother discussed placement into a level III group home, and CSW offered to assist with placement. Mother declined. Mother insists that pt requires a level IV (PRTF) placement, stating that he needs to be in a locked facility and that he would not succeed in a lower level of care. CSW and mother discussed that a level IV will not be pursued by hospital, as is behavior in hospital does not justify that placement. A care coordinator, Feliciana RossettiKelvin MCCray [phone], is already currently assisting pt with group home placement. Mother stated that she would pick pt up later today once she identifies a relative he can stay with. She expressed frustration with [place] [facility] and says she is going to work on completing paperwork so that [place] and live with his father.  York SpanielAlexandra Shannette Tabares [facility] Clinical Social Worker [facility] phone: [phone]

## 2014-10-07 NOTE — ED Notes (Signed)
Pt ambulatory to dc window w/ RN, belongings returned after leaving the unit.  Written dc instructions reviewed w/ patient mother. Mom encouraged to continue to follow up w/ his case worker in making further araingements for him  Pt encouraged to work with his mother and tell her/others if he has thoughts of hurting his self or others.  Pt verbalized understanding.

## 2014-10-07 NOTE — Progress Notes (Signed)
3:15pm. CSW received call from pt's mother/guardian. Mother states she is coming to pick pt up from hospital now.  York SpanielAlexandra Dorsel Flinn [facility] Clinical Social Worker [facility] phone: [phone]

## 2014-10-07 NOTE — ED Notes (Signed)
In the bathroom to shower and change scrubs 

## 2014-10-07 NOTE — [facility] Suicide Risk Assessment (Cosign Needed)
Suicide Risk Assessment  Discharge Assessment   [facility]   Demographic Factors:  Male, Adolescent or young adult, Caucasian, Low socioeconomic status and Unemployed  Total Time spent with patient: 30 minutes  Musculoskeletal: Strength & Muscle Tone: within normal limits Gait & Station: normal Patient leans: N/A  Psychiatric Specialty Exam:     Blood pressure 117/58, pulse 99, temperature 97.5 F (36.4 C), temperature source Oral, resp. rate 18, SpO2 100 %.There is no weight on file to calculate BMI.  General Appearance: Casual  Eye Contact::  Good  Speech:  Clear and Coherent and Slow409  Volume:  Decreased  Mood:  Anxious  Affect:  Blunt  Thought Process:  Coherent  Orientation:  Full (Time, Place, and Person)  Thought Content:  WDL  Suicidal Thoughts:  No  Homicidal Thoughts:  No  Memory:  Immediate;   Fair Recent;   Fair Remote;   Fair  Judgement:  Fair  Insight:  Fair  Psychomotor Activity:  Normal  Concentration:  Fair  Recall:  NA  Fund of Knowledge:Fair  Language: Fair  Akathisia:  NA  Handed:  Right  AIMS (if indicated):     Assets:  Desire for Improvement  Sleep:     Cognition: Impaired,  Mild  ADL's:  Intact      Has this patient used any form of tobacco in the last 30 days? (Cigarettes, Smokeless Tobacco, Cigars, and/or Pipes) N/A  Mental Status Per Nursing Assessment::   On Admission:     Current Mental Status by Physician: NA  Loss Factors: NA  Historical Factors: NA  Risk Reduction Factors:   Living with another person, especially a relative  Continued Clinical Symptoms:  Depression:   Insomnia  Cognitive Features That Contribute To Risk:  Polarized thinking    Suicide Risk:  Minimal: No identifiable suicidal ideation.  Patients presenting with no risk factors but with morbid ruminations; may be classified as minimal risk based on the severity of the depressive symptoms  Principal Problem:  Aggressive behavior Discharge Diagnoses:  Patient Active Problem List   Diagnosis Date Noted  . Aggressive behavior [F60.89]     Priority: High  . Involuntary commitment [Z04.6]   . Aggression [F60.89] 10/02/2014  . Autism [F84.0] 10/02/2014  . 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 Post traumatic stress disorder (PTSD) [F43.10] 05/20/2013  . Excessive anger [F91.1] 05/20/2013  . ADD (attention deficit disorder) [F90.9] 05/20/2013  . Vitamin D insufficiency [E55.9] 12/30/2012  . Loss of weight [R63.4] 12/30/2012  . Abnormal weight loss [R63.4] 12/21/2012  . Intellectual disability [F79] 11/03/2012  . ADHD (attention deficit hyperactivity disorder), combined type [F90.2] 11/03/2012  . 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 Environmental allergies [Z91.048] 11/03/2012  . 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 Other convulsions [R56.9] 10/05/2012  . Essential and other specified forms of tremor [R25.1] 10/05/2012  . Unspecified mental or behavioral problem [F69] 10/05/2012  . 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

## 2014-10-07 NOTE — ED Notes (Signed)
On the phone 

## 2014-10-07 NOTE — ED Notes (Addendum)
Up to the bathroom 

## 2015-09-10 ENCOUNTER — Emergency Department (HOSPITAL_COMMUNITY): Payer: Medicare Other

## 2015-09-10 ENCOUNTER — Encounter (HOSPITAL_COMMUNITY): Payer: Self-pay | Admitting: *Deleted

## 2015-09-10 DIAGNOSIS — S4992XA Unspecified injury of left shoulder and upper arm, initial encounter: Secondary | ICD-10-CM | POA: Diagnosis present

## 2015-09-10 DIAGNOSIS — Z23 Encounter for immunization: Secondary | ICD-10-CM | POA: Diagnosis not present

## 2015-09-10 DIAGNOSIS — F329 Major depressive disorder, single episode, unspecified: Secondary | ICD-10-CM | POA: Diagnosis not present

## 2015-09-10 DIAGNOSIS — Y9289 Other specified places as the place of occurrence of the external cause: Secondary | ICD-10-CM | POA: Insufficient documentation

## 2015-09-10 DIAGNOSIS — Y9389 Activity, other specified: Secondary | ICD-10-CM | POA: Diagnosis not present

## 2015-09-10 DIAGNOSIS — S40212A Abrasion of left shoulder, initial encounter: Secondary | ICD-10-CM | POA: Diagnosis not present

## 2015-09-10 DIAGNOSIS — Y998 Other external cause status: Secondary | ICD-10-CM | POA: Diagnosis not present

## 2015-09-10 DIAGNOSIS — F909 Attention-deficit hyperactivity disorder, unspecified type: Secondary | ICD-10-CM | POA: Diagnosis not present

## 2015-09-10 DIAGNOSIS — F840 Autistic disorder: Secondary | ICD-10-CM | POA: Insufficient documentation

## 2015-09-10 DIAGNOSIS — Z79899 Other long term (current) drug therapy: Secondary | ICD-10-CM | POA: Diagnosis not present

## 2015-09-10 DIAGNOSIS — S0990XA Unspecified injury of head, initial encounter: Secondary | ICD-10-CM | POA: Diagnosis not present

## 2015-09-10 DIAGNOSIS — Z87891 Personal history of nicotine dependence: Secondary | ICD-10-CM | POA: Diagnosis not present
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
## 2015-09-11 ENCOUNTER — Emergency Department (HOSPITAL_COMMUNITY)
Admission: EM | Admit: 2015-09-11 | Discharge: 2015-09-11 | Disposition: A | Discharge status: Home / Self Care | Payer: Medicare Other | Attending: Emergency Medicine | Admitting: Emergency Medicine

## 2015-09-11 DIAGNOSIS — S4992XA Unspecified injury of left shoulder and upper arm, initial encounter: Secondary | ICD-10-CM

## 2015-09-11 DIAGNOSIS — S40212A Abrasion of left shoulder, initial encounter: Secondary | ICD-10-CM | POA: Diagnosis not present

## 2015-09-11 DIAGNOSIS — S0990XA Unspecified injury of head, initial encounter: Secondary | ICD-10-CM

## 2015-09-11 DIAGNOSIS — T148XXA Other injury of unspecified body region, initial encounter: Secondary | ICD-10-CM

## 2015-09-11 MED ORDER — TETANUS-DIPHTH-ACELL PERTUSSIS 5-2.5-18.5 LF-MCG/0.5 IM SUSP
0.5000 mL | Freq: Once | INTRAMUSCULAR | Status: AC
  Administered 2015-09-11: 0.5 mL via INTRAMUSCULAR
  Filled 2015-09-11: qty 0.5

## 2015-09-11 MED ORDER — ACETAMINOPHEN 500 MG PO TABS
1000.0000 mg | ORAL_TABLET | Freq: Once | ORAL | Status: AC
  Administered 2015-09-11: 1000 mg via ORAL
  Filled 2015-09-11: qty 2
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
## 2015-10-20 ENCOUNTER — Emergency Department (HOSPITAL_COMMUNITY)
Admission: EM | Admit: 2015-10-20 | Discharge: 2015-10-21 | Disposition: A | Discharge status: Home / Self Care | Payer: Medicare Other | Attending: Emergency Medicine | Admitting: Emergency Medicine

## 2015-10-20 ENCOUNTER — Encounter (HOSPITAL_COMMUNITY): Payer: Self-pay | Admitting: Emergency Medicine

## 2015-10-20 DIAGNOSIS — R45850 Homicidal ideations: Secondary | ICD-10-CM

## 2015-10-20 DIAGNOSIS — Z720 Tobacco use: Secondary | ICD-10-CM | POA: Diagnosis not present

## 2015-10-20 DIAGNOSIS — F329 Major depressive disorder, single episode, unspecified: Secondary | ICD-10-CM | POA: Insufficient documentation

## 2015-10-20 DIAGNOSIS — R45851 Suicidal ideations: Secondary | ICD-10-CM | POA: Diagnosis present

## 2015-10-20 DIAGNOSIS — Z79899 Other long term (current) drug therapy: Secondary | ICD-10-CM | POA: Diagnosis not present

## 2015-10-20 DIAGNOSIS — F4325 Adjustment disorder with mixed disturbance of emotions and conduct: Secondary | ICD-10-CM

## 2015-10-20 DIAGNOSIS — G40909 Epilepsy, unspecified, not intractable, without status epilepticus: Secondary | ICD-10-CM | POA: Diagnosis not present

## 2015-10-20 DIAGNOSIS — F909 Attention-deficit hyperactivity disorder, unspecified type: Secondary | ICD-10-CM | POA: Diagnosis not present
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
## 2015-10-21 DIAGNOSIS — F4325 Adjustment disorder with mixed disturbance of emotions and conduct: Secondary | ICD-10-CM | POA: Diagnosis not present

## 2015-10-21 DIAGNOSIS — R45851 Suicidal ideations: Secondary | ICD-10-CM | POA: Diagnosis not present

## 2015-10-21 LAB — CBC WITH DIFFERENTIAL/PLATELET
Basophils Absolute: 0 10*3/uL (ref 0.0–0.1)
Basophils Relative: 0 %
Eosinophils Absolute: 0 10*3/uL (ref 0.0–0.7)
Eosinophils Relative: 0 %
HEMATOCRIT: 39.9 % (ref 39.0–52.0)
Hemoglobin: 13.8 g/dL (ref 13.0–17.0)
Lymphocytes Relative: 22 %
Lymphs Abs: 1.4 10*3/uL (ref 0.7–4.0)
MCH: 31.1 pg (ref 26.0–34.0)
MCHC: 34.6 g/dL (ref 30.0–36.0)
MCV: 89.9 fL (ref 78.0–100.0)
Monocytes Absolute: 0.5 10*3/uL (ref 0.1–1.0)
Monocytes Relative: 8 %
Neutro Abs: 4.6 10*3/uL (ref 1.7–7.7)
Neutrophils Relative %: 70 %
PLATELETS: 197 10*3/uL (ref 150–400)
RBC: 4.44 MIL/uL (ref 4.22–5.81)
RDW: 12.8 % (ref 11.5–15.5)
WBC: 6.5 10*3/uL (ref 4.0–10.5)

## 2015-10-21 LAB — RAPID URINE DRUG SCREEN, HOSP PERFORMED
AMPHETAMINES: NOT DETECTED
BENZODIAZEPINES: NOT DETECTED
Barbiturates: NOT DETECTED
Cocaine: NOT DETECTED
Opiates: NOT DETECTED
Tetrahydrocannabinol: NOT DETECTED

## 2015-10-21 LAB — SALICYLATE LEVEL

## 2015-10-21 LAB — COMPREHENSIVE METABOLIC PANEL
ALT: 19 U/L (ref 17–63)
AST: 26 U/L (ref 15–41)
Albumin: 4.2 g/dL (ref 3.5–5.0)
Alkaline Phosphatase: 59 U/L (ref 38–126)
Anion gap: 7 (ref 5–15)
BUN: 15 mg/dL (ref 6–20)
CHLORIDE: 105 mmol/L (ref 101–111)
CO2: 26 mmol/L (ref 22–32)
CREATININE: 1.19 mg/dL (ref 0.61–1.24)
Calcium: 8.9 mg/dL (ref 8.9–10.3)
GFR calc Af Amer: >60 mL/min (ref >60)
GFR calc non Af Amer: >60 mL/min (ref >60)
Glucose, Bld: 107 mg/dL — ABNORMAL HIGH (ref 65–99)
Potassium: 3.5 mmol/L (ref 3.5–5.1)
Sodium: 138 mmol/L (ref 135–145)
Total Bilirubin: 0.4 mg/dL (ref 0.3–1.2)
Total Protein: 7.4 g/dL (ref 6.5–8.1)

## 2015-10-21 LAB — ACETAMINOPHEN LEVEL: Acetaminophen (Tylenol), Serum: <10 ug/mL — ABNORMAL LOW (ref 10–30)

## 2015-10-21 LAB — ETHANOL: Alcohol, Ethyl (B): <5 mg/dL (ref <5)

## 2015-10-21 MED ORDER — OXCARBAZEPINE 300 MG PO TABS
300.0000 mg | ORAL_TABLET | Freq: Two times a day (BID) | ORAL | Status: DC
  Administered 2015-10-21 (×2): 300 mg via ORAL
  Filled 2015-10-21 (×2): qty 1

## 2015-10-21 MED ORDER — HYDROXYZINE HCL 25 MG PO TABS
75.0000 mg | ORAL_TABLET | Freq: Every day | ORAL | Status: DC
  Administered 2015-10-21: 75 mg via ORAL
  Filled 2015-10-21: qty 3

## 2015-10-21 MED ORDER — METHYLPHENIDATE HCL ER (OSM) 27 MG PO TBCR
54.0000 mg | EXTENDED_RELEASE_TABLET | Freq: Every morning | ORAL | Status: DC
  Administered 2015-10-21: 54 mg via ORAL
  Filled 2015-10-21: qty 2

## 2015-10-21 MED ORDER — ALUM & MAG HYDROXIDE-SIMETH 200-200-20 MG/5ML PO SUSP: 30.0000 mL | ORAL | Status: DC | PRN

## 2015-10-21 MED ORDER — ONDANSETRON HCL 4 MG PO TABS: 4.0000 mg | ORAL_TABLET | Freq: Three times a day (TID) | ORAL | Status: DC | PRN

## 2015-10-21 MED ORDER — ACETAMINOPHEN 325 MG PO TABS
650.0000 mg | ORAL_TABLET | ORAL | Status: DC | PRN
Start: 2015-10-21 — End: 2015-10-21

## 2015-10-21 MED ORDER — CLONIDINE HCL 0.1 MG PO TABS
0.1000 mg | ORAL_TABLET | Freq: Two times a day (BID) | ORAL | Status: DC
  Administered 2015-10-21 (×2): 0.1 mg via ORAL
  Filled 2015-10-21 (×2): qty 1

## 2015-10-21 MED ORDER — LORAZEPAM 1 MG PO TABS: 1.0000 mg | ORAL_TABLET | Freq: Three times a day (TID) | ORAL | Status: DC | PRN

## 2015-10-21 MED ORDER — ZOLPIDEM TARTRATE 5 MG PO TABS
5.0000 mg | ORAL_TABLET | Freq: Every evening | ORAL | Status: DC | PRN
  Administered 2015-10-21: 5 mg via ORAL
  Filled 2015-10-21: qty 1

## 2015-10-21 MED ORDER — IBUPROFEN 200 MG PO TABS: 600.0000 mg | ORAL_TABLET | Freq: Three times a day (TID) | ORAL | Status: DC | PRN

## 2015-10-21 MED ORDER — SERTRALINE HCL 50 MG PO TABS
50.0000 mg | ORAL_TABLET | Freq: Every day | ORAL | Status: DC
  Administered 2015-10-21: 50 mg via ORAL
  Filled 2015-10-21: qty 1

## 2015-10-21 MED ORDER — NICOTINE 21 MG/24HR TD PT24
21.0000 mg | MEDICATED_PATCH | Freq: Every day | TRANSDERMAL | Status: DC
  Administered 2015-10-21 (×2): 21 mg via TRANSDERMAL
  Filled 2015-10-21 (×2): qty 1

## 2015-10-21 MED ORDER — CARBAMAZEPINE ER 100 MG PO CP12
300.0000 mg | ORAL_CAPSULE | Freq: Two times a day (BID) | ORAL | Status: DC
  Administered 2015-10-21: 300 mg via ORAL
  Filled 2015-10-21 (×2): qty 3

## 2015-10-21 NOTE — ED Provider Notes (Signed)
CSN: [id number]     Arrival date & time 10/20/15  2353 History   First MD Initiated Contact with Patient 10/21/15 0007     Chief Complaint  Patient presents with  . Suicidal     (Consider location/radiation/quality/duration/timing/severity/associated sxs/prior Treatment) HPI Comments: Geoffrey [name] is a 20 y.o. male with a PMHx of seizures, aggressive behavior, conduct disorder, ADHD, depression, and developmental delay, who presents to the ED in [employer] custody, there were called out for a missing person report, found him at his mother's house with a book with matches in his pockets and he told them he was going to set someone's house on fire, he also reported to them that he would rather "kill himself and go back to his foster dad's house". He acknowledges this to me as being accurate, stating that he has had some suicidal thoughts over the last several months, but has no specific plan. He states that he does not get along with his foster dad, but denies any abuse. States he feels safe at home but only with his mother because of not getting along with his father. He denies any AVH, or illicit drug use, or alcohol use. He admits to being somewhat anxious. He does admit to smoking cigarettes approximately 1 pack per week. States that he has not consistently taken his medications in approximately 3 months, but takes some intermittently and reported to pharmacy tech that he had taken most of his medicines in the last 48 hours. He is currently here voluntarily.   Patient is a 20 y.o. male presenting with mental health disorder. The history is provided by the patient and medical records. No language interpreter was used.  Mental Health Problem Presenting symptoms: homicidal ideas, suicidal thoughts and suicidal threats   Presenting symptoms: no hallucinations   Patient accompanied by:  Law enforcement Onset quality:  Gradual Duration: "several months" Timing:  Constant Progression:   Unchanged Chronicity:  Chronic Context: noncompliance and stressful life event (doesn't get along with his foster dad)   Context: not alcohol use and not drug abuse   Treatment compliance:  Some of the time Time since last psychoactive medication taken:  3 months (states doesn't take it consistently since 3 mos ago, but occasionally takes them intermittently) Relieved by:  None tried Worsened by:  Family interactions Ineffective treatments:  None tried Associated symptoms: anxiety   Associated symptoms: no abdominal pain and no chest pain   Risk factors: hx of mental illness     Past Medical History  Diagnosis Date  . Seizures (HCC)   . Aggressive behavior   . Conduct disorder   . ADHD (attention deficit hyperactivity disorder)   . Delay in development   . Depression    Past Surgical History  Procedure Laterality Date  . No past surgeries     Family History  Problem Relation Age of Onset  . Epilepsy Father   . Cerebral palsy Brother   . Healthy Brother   . Asthma Other   . Cancer Other   . Diabetes Other   . Stroke Other   . COPD Other   . Heart attack Other    Social History  Substance Use Topics  . Smoking status: Former Games developermoker  . Smokeless tobacco: Never Used  . Alcohol Use: Yes     Comment: Patient denies     Review of Systems  Constitutional: Negative for fever and chills.  Respiratory: Negative for shortness of breath.   Cardiovascular: Negative for chest pain.  Gastrointestinal: Negative for nausea, vomiting, abdominal pain, diarrhea and constipation.  Genitourinary: Negative for dysuria and hematuria.  Musculoskeletal: Negative for myalgias and arthralgias.  Skin: Negative for color change.  Allergic/Immunologic: Negative for immunocompromised state.  Neurological: Negative for weakness and numbness.  Psychiatric/Behavioral: Positive for suicidal ideas and homicidal ideas. Negative for hallucinations and confusion. The patient is nervous/anxious.     10 Systems reviewed and are negative for acute change except as noted in the HPI.    Allergies  Review of patient's allergies indicates no known allergies.  Home Medications   Prior to Admission medications   Medication Sig Start Date End Date Taking? Authorizing Provider  ARIPiprazole (ABILIFY) 10 MG tablet Take 1 tablet (10 mg total) by mouth daily. 10/05/14  Yes Earney Navy, NP  carbamazepine (TEGRETOL) 200 MG tablet Take 1.5 tablets (300 mg total) by mouth 2 (two) times daily. 10/05/14  Yes Earney Navy, NP  cloNIDine (CATAPRES) 0.1 MG tablet Take 1 tablet (0.1 mg total) by mouth 2 (two) times daily. 10/05/14  Yes Earney Navy, NP  Methylphenidate HCl ER 54 MG TB24 Take 1 tablet by mouth daily. 09/20/14  Yes Historical Provider, MD  sertraline (ZOLOFT) 100 MG tablet Take 1 tablet (100 mg total) by mouth daily. 10/05/14  Yes Earney Navy, NP   BP 116/75 mmHg  Pulse 76  Temp(Src) 98.6 F (37 C) (Oral)  Resp 18  Ht 6\' 3"  (1.905 m)  Wt 68.04 kg  BMI 18.75 kg/m2  SpO2 99% Physical Exam  Constitutional: He is oriented to person, place, and time. Vital signs are normal. He appears well-developed and well-nourished.  Non-toxic appearance. No distress.  Afebrile, nontoxic, NAD. Subdued but cooperative.  HENT:  Head: Normocephalic and atraumatic.  Mouth/Throat: Oropharynx is clear and moist and mucous membranes are normal.  Eyes: Conjunctivae and EOM are normal. Right eye exhibits no discharge. Left eye exhibits no discharge.  Neck: Normal range of motion. Neck supple.  Cardiovascular: Normal rate, regular rhythm, normal heart sounds and intact distal pulses.  Exam reveals no gallop and no friction rub.   No murmur heard. Pulmonary/Chest: Effort normal and breath sounds normal. No respiratory distress. He has no decreased breath sounds. He has no wheezes. He has no rhonchi. He has no rales.  Abdominal: Soft. Normal appearance and bowel sounds are normal. He  exhibits no distension. There is no tenderness. There is no rigidity, no rebound, no guarding, no CVA tenderness, no tenderness at McBurney's point and negative Murphy's sign.  Musculoskeletal: Normal range of motion.  Neurological: He is alert and oriented to person, place, and time. He has normal strength. No sensory deficit.  Skin: Skin is warm, dry and intact. No rash noted.  Psychiatric: He has a normal mood and affect. He is not actively hallucinating. He expresses homicidal and suicidal ideation. He expresses homicidal plans. He expresses no suicidal plans.  Cooperative and calm, subdued but communicative, denies AVH but endorses suicidal thoughts without a plan, and HI with a plan to burn someone's house down.  Nursing note and vitals reviewed.   ED Course  Procedures (including critical care time) Labs Review Labs Reviewed  CBC WITH DIFFERENTIAL/PLATELET  COMPREHENSIVE METABOLIC PANEL  ETHANOL  URINE RAPID DRUG SCREEN, HOSP PERFORMED  SALICYLATE LEVEL  ACETAMINOPHEN LEVEL    Imaging Review No results found. I have personally reviewed and evaluated these images and lab results as part of my medical decision-making.   EKG Interpretation None  MDM   Final diagnoses:  Suicidal thoughts  Homicidal ideations  Tobacco use    20 y.o. male here for SI and HI. Brought in by [employer] after running away, had book of matches and told them he was going to burn someone's house down; told them he'd rather kill himself than go back to his foster dad's house. States he doesn't get along with his dad, although denies being abused by him. Endorses that these SI thoughts have been ongoing for a while. No plan in mind. Denies AVH, drug use, or alcohol use. Smokes infrequently. Here voluntarily for now, pleasant and cooperative. Willing to stay for eval. If he tried to leave we'd have to IVC him, but for now will hold off on this. Will get clearance labs and TTS consultation. Reordered home  meds, states he hasn't taken them in several months but then says he still sometimes takes them, so will keep him on all his normal meds. Will reassess after labs  12:48 AM CBC WNL, all other labs pending. At shift change, will sign out care to Danelle Berry PA-C who will look at labs and medically clear once they return. Please see her notes for further documentation of med clearance status and lab results. Pt stable at this time, all orders in place.   BP 116/75 mmHg  Pulse 76  Temp(Src) 98.6 F (37 C) (Oral)  Resp 18  Ht  (1.905 m)  Wt 68.04 kg  BMI 18.75 kg/m2  SpO2 99%  Meds ordered this encounter  Medications  . carbamazepine (CARBATROL) 12 hr capsule 300 mg    Sig:   . cloNIDine (CATAPRES) tablet 0.1 mg    Sig:   . hydrOXYzine (ATARAX/VISTARIL) tablet 75 mg    Sig:   . methylphenidate (CONCERTA) CR tablet 54 mg    Sig:   . Oxcarbazepine (TRILEPTAL) tablet 300 mg    Sig:   . sertraline (ZOLOFT) tablet 50 mg    Sig:   . alum & mag hydroxide-simeth (MAALOX/MYLANTA) 200-200-20 MG/5ML suspension 30 mL    Sig:   . ondansetron (ZOFRAN) tablet 4 mg    Sig:   . nicotine (NICODERM CQ - dosed in mg/24 hours) patch 21 mg    Sig:   . zolpidem (AMBIEN) tablet 5 mg    Sig:   . ibuprofen (ADVIL,MOTRIN) tablet 600 mg    Sig:   . acetaminophen (TYLENOL) tablet 650 mg    Sig:   . LORazepam (ATIVAN) tablet 1 mg    Sig:      Francesa Eugenio Camprubi-Soms, PA-C 10/21/15 9147  Derwood Kaplan, MD 10/21/15 [phone]

## 2015-10-21 NOTE — ED Provider Notes (Signed)
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, evaluated by CBS CorporationMercedes Camprubi-soms PA-C, for HI, SI, given to me at shift change with labs pending, patient here voluntarily, psych cold orders and TTS completed, however labs pending.  Results for orders placed or performed during the hospital encounter of 10/20/15  Comprehensive metabolic panel  Result Value Ref Range   Sodium 138 135 - 145 mmol/L   Potassium 3.5 3.5 - 5.1 mmol/L   Chloride 105 101 - 111 mmol/L   CO2 26 22 - 32 mmol/L   Glucose, Bld 107 (H) 65 - 99 mg/dL   BUN 15 6 - 20 mg/dL   Creatinine, Ser 1.911.19 0.61 - 1.24 mg/dL   Calcium 8.9 8.9 - 47.810.3 mg/dL   Total Protein 7.4 6.5 - 8.1 g/dL   Albumin 4.2 3.5 - 5.0 g/dL   AST 26 15 - 41 U/L   ALT 19 17 - 63 U/L   Alkaline Phosphatase 59 38 - 126 U/L   Total Bilirubin 0.4 0.3 - 1.2 mg/dL   GFR calc non Af Amer >60 >60 mL/min   GFR calc Af Amer >60 >60 mL/min   Anion gap 7 5 - 15  Ethanol  Result Value Ref Range   Alcohol, Ethyl (B) <5 <5 mg/dL  CBC with Diff  Result Value Ref Range   WBC 6.5 4.0 - 10.5 K/uL   RBC 4.44 4.22 - 5.81 MIL/uL   Hemoglobin 13.8 13.0 - 17.0 g/dL   HCT 29.539.9 62.139.0 - 30.852.0 %   MCV 89.9 78.0 - 100.0 fL   MCH 31.1 26.0 - 34.0 pg   MCHC 34.6 30.0 - 36.0 g/dL   RDW 65.712.8 84.611.5 - 96.215.5 %   Platelets 197 150 - 400 K/uL   Neutrophils Relative % 70 %   Neutro Abs 4.6 1.7 - 7.7 K/uL   Lymphocytes Relative 22 %   Lymphs Abs 1.4 0.7 - 4.0 K/uL   Monocytes Relative 8 %   Monocytes Absolute 0.5 0.1 - 1.0 K/uL   Eosinophils Relative 0 %   Eosinophils Absolute 0.0 0.0 - 0.7 K/uL   Basophils Relative 0 %   Basophils Absolute 0.0 0.0 - 0.1 K/uL  Urine rapid drug screen (hosp performed)[facility]  Result Value Ref Range   Opiates NONE DETECTED NONE DETECTED   Cocaine NONE DETECTED NONE DETECTED   Benzodiazepines NONE DETECTED NONE DETECTED   Amphetamines NONE DETECTED NONE DETECTED   Tetrahydrocannabinol NONE DETECTED NONE DETECTED   Barbiturates NONE DETECTED NONE DETECTED  Salicylate level  Result Value Ref Range   Salicylate Lvl <4.0 2.8 - 30.0 mg/dL  Acetaminophen level  Result Value Ref Range   Acetaminophen (Tylenol), Serum <10 (L) 10 - 30 ug/mL   No results found.  All labs reviewed, within normal limits, vital signs stable. Patient medically cleared, disposition [facility].  1:30 AM Danelle BerryLeisa Markise Haymer, PA-C      Danelle BerryLeisa Mazy Culton, PA-C 10/21/15 0130  Derwood KaplanAnkit Nanavati, MD 10/21/15 [phone]

## 2015-10-21 NOTE — ED Notes (Signed)
Resting quietly with eye closed. Easily arousable. Verbally responsive. Resp even and unlabored. ABC's intact. No behavior problems noted. Pt denies SI/HI and AVH at this time. NAD noted. Sitter at bedside.

## 2015-10-21 NOTE — ED Notes (Signed)
Bed: [facility] Expected date:  Expected time:  Means of arrival:  Comments: Tr5 

## 2015-10-21 NOTE — BH Assessment (Addendum)
Tele Assessment Note   Geoffrey [name] is an 20 y.o. male. Pt reports SI without intent or plan. Pt denies any h/o suicide attempt or inpatient admissions. Pt reports h/o cutting. Pt states that his mother (legal guardian) placed him in a foster home but, he does not know why. Pt reports that he dislikes foster home and wants to return to his mother's home. Pt voiced concerns regarding headaches, and his hands and arms shaking. Clinician did not observe any shaking/tremors during clinical interview. Pt states "my nerves are really bad" and referred to shaking hands and arms when prompted to explain further. Pt reports he was non-complaint with medications but could not recall prescription names. Mom reports pt h/o Autism Spectrum Disorder. Mom stated that she believes pt wants to live with her to be close to a girlfriend. Mom was unable to recall the name of pt's prescriptions and did not have foster home contact number on file.   Diagnosis: F84.0 Autism Spectrum D/o (per report)  Past Medical History:  Past Medical History  Diagnosis Date  . Seizures (HCC)   . Aggressive behavior   . Conduct disorder   . ADHD (attention deficit hyperactivity disorder)   . Delay in development   . Depression     Past Surgical History  Procedure Laterality Date  . No past surgeries      Family History:  Family History  Problem Relation Age of Onset  . Epilepsy Father   . Cerebral palsy Brother   . Healthy Brother   . Asthma Other   . Cancer Other   . Diabetes Other   . Stroke Other   . COPD Other   . Heart attack Other     Social History:  reports that he has quit smoking. He has never used smokeless tobacco. He reports that he drinks alcohol. He reports that he uses illicit drugs.  Additional Social History:  Alcohol / Drug Use Pain Medications: See MAR Prescriptions: See Mar, pt reports non-compliance but does not recall prescriptions Over the Counter: See MAR History of alcohol / drug  use?: No history of alcohol / drug abuse  CIWA: CIWA-Ar BP: 116/75 mmHg Pulse Rate: 76 COWS:    PATIENT STRENGTHS: (choose at least two) Average or above average intelligence  Allergies: No Known Allergies  Home Medications:  (Not in a hospital admission)  OB/GYN Status:  No LMP for male patient.  General Assessment Data Location of Assessment: [facility] ED TTS Assessment: In system Is this a Tele or Face-to-Face Assessment?: Tele Assessment Is this an Initial Assessment or a Re-assessment for this encounter?: Initial Assessment Marital status: Single Is patient pregnant?: No Pregnancy Status: No Living Arrangements: Non-relatives/Friends [employer] Parents) Can pt return to current living arrangement?: Yes Admission Status: Voluntary Is patient capable of signing voluntary admission?: No (Pt has legal guardian) Referral Source: Self/Family/Friend Insurance type: Medicaid     Crisis Care Plan Living Arrangements: Non-relatives/Friends Tax adviser Parents) Armed forces operational officer Guardian: Mother Name of Psychiatrist: None Reported Name of Therapist: None Reported  Education Status Is patient currently in school?: Yes Current Grade: 12th Highest grade of school patient has completed: 11th Name of school: Not Provided Contact person: Not Provided  Risk to self with the past 6 months Suicidal Ideation: Yes-Currently Present Has patient been a risk to self within the past 6 months prior to admission? : No Suicidal Intent: No Has patient had any suicidal intent within the past 6 months prior to admission? : No Is patient at risk  for suicide?: No Suicidal Plan?: No Has patient had any suicidal plan within the past 6 months prior to admission? : No Access to Means: No What has been your use of drugs/alcohol within the last 12 months?: None Reported Previous Attempts/Gestures: No Other Self Harm Risks: h/o aspergers per chart Intentional Self Injurious Behavior: Cutting Comment - Self Injurious  Behavior: Pt reports h/o cutting and states "I don't remember" when asked last time Family Suicide History: No Recent stressful life event(s): Other (Comment) (Pt dislikes foster home and wants to live with mother) Persecutory voices/beliefs?: No Depression: No Substance abuse history and/or treatment for substance abuse?: No Suicide prevention information given to non-admitted patients: Not applicable  Risk to Others within the past 6 months Homicidal Ideation: No Does patient have any lifetime risk of violence toward others beyond the six months prior to admission? : No Thoughts of Harm to Others: No Current Homicidal Intent: No Current Homicidal Plan: No Access to Homicidal Means: No History of harm to others?: No Assessment of Violence: None Noted Does patient have access to weapons?: No Criminal Charges Pending?: No Does patient have a court date: No Is patient on probation?: No  Psychosis Hallucinations: None noted Delusions: None noted  Mental Status Report Appearance/Hygiene: In scrubs Eye Contact: Poor (appeared to be watching tv) Motor Activity: Unremarkable Speech: Logical/coherent Level of Consciousness: Alert Mood: Apprehensive Affect: Other (Comment) (congruent) Anxiety Level: Minimal Thought Processes: Coherent, Relevant Judgement: Partial Orientation: Person, Situation, Place Obsessive Compulsive Thoughts/Behaviors: None  Cognitive Functioning Concentration: Fair Memory: Remote Intact, Recent Intact IQ: Average Insight: Poor Impulse Control: Fair Appetite: Good Weight Loss:  (reports weight loss of unknown amt.) Weight Gain: 0 Sleep: No Change Total Hours of Sleep:  (Pt unable to quantify "I sleep pretty good") Vegetative Symptoms: None  ADLScreening [facility] Assessment Services) Patient's cognitive ability adequate to safely complete daily activities?: Yes Patient able to express need for assistance with ADLs?: Yes Independently performs ADLs?: Yes  (appropriate for developmental age)  Prior Inpatient Therapy Prior Inpatient Therapy: No  Prior Outpatient Therapy Prior Outpatient Therapy: No Does patient have an ACCT team?: No Does patient have Intensive In-House Services?  : No Does patient have [facility] services? : Unknown Does patient have P4CC services?: No  ADL Screening (condition at time of admission) Patient's cognitive ability adequate to safely complete daily activities?: Yes Is the patient deaf or have difficulty hearing?: No Does the patient have difficulty seeing, even when wearing glasses/contacts?: No Does the patient have difficulty concentrating, remembering, or making decisions?: No Patient able to express need for assistance with ADLs?: Yes Does the patient have difficulty dressing or bathing?: No Independently performs ADLs?: Yes (appropriate for developmental age) Does the patient have difficulty walking or climbing stairs?: No Weakness of Legs: None Weakness of Arms/Hands: None  Home Assistive Devices/Equipment Home Assistive Devices/Equipment: None    Abuse/Neglect Assessment (Assessment to be complete while patient is alone) Physical Abuse: Denies Verbal Abuse: Denies Sexual Abuse: Denies Exploitation of patient/patient's resources: Denies Self-Neglect: Denies Values / Beliefs Cultural Requests During Hospitalization: None Spiritual Requests During Hospitalization: None Consults Spiritual Care Consult Needed: No Social Work Consult Needed: No Merchant navy officer (For Healthcare) Does patient have an advance directive?: No Would patient like information on creating an advanced directive?: No - patient declined information    Additional Information 1:1 In Past 12 Months?: No CIRT Risk: No Elopement Risk: No Does patient have medical clearance?: No     Disposition:  Disposition Initial Assessment Completed for  this Encounter: Yes Disposition of Patient: Other dispositions Other  disposition(s): Other (Comment) (Pending Psychiatric Extender Recommendation)  Ariane Ditullio J [place] 10/21/2015 1:27 AM

## 2015-10-21 NOTE — ED Notes (Signed)
Feliciana Rossettioshanna Williams, POA/mother called and reported that she wanted to psych MD report on pt's behavior prior to them seeing him.

## 2015-10-21 NOTE — Progress Notes (Addendum)
At the request of the Psychiatrist, CSW attempted a call to Feliciana Rossettioshanna Williams, patient's mother and POA to inform her that patient had been cleared psychiatrically and was ready for discharge. CSW left a message with name and contact number for return call. CSW informed Psychiatrist and NP of this information.  Feliciana RossettiRoshanna Williams, mother/POA [phone]  Elenore PaddyLaVonia Shakaria Raphael, LCSWA [phone] ED CSW 10/21/2015 11:26 AM

## 2015-10-21 NOTE — ED Notes (Signed)
Pt's mother/POA asked to speak w/ this Charge RN when she arrived to pick up Pt.  Mother/POA upset that psych team did not contact her prior to d/c and Pt was left in the lobby alone.  Mother/POA informed that Marylee FlorasBlanche RN had given her number to the psych team and this Charge RN apologized for them not calling.  Also, Mother/POA assured that Marylee FlorasBlanche RN had confirmed that she(Mother/POA) was on the way prior to d/c.  Mother/POA verbalized understanding and reports that the psych team would be hearing from their attorney.

## 2015-10-21 NOTE — ED Notes (Signed)
Awake. Verbally responsive. A/O x4. Resp even and unlabored. No audible adventitious breath sounds noted. ABC's intact.  

## 2015-10-21 NOTE — [facility] Suicide Risk Assessment (Signed)
Suicide Risk Assessment  Discharge Assessment   [facility]   Principal Problem: Adjustment disorder with mixed disturbance of emotions and conduct Discharge Diagnoses:  Patient Active Problem List   Diagnosis Date Noted  . Adjustment disorder with mixed disturbance of emotions and conduct [F43.25] 10/21/2015    Priority: High  . Aggression [F60.89] 10/02/2014    Priority: High  . Autism [F84.0] 10/02/2014    Priority: High  . Post traumatic stress disorder (PTSD) [F43.10] 05/20/2013    Priority: High  . Excessive anger [F91.1] 05/20/2013    Priority: High  . ADD (attention deficit disorder) [F90.9] 05/20/2013    Priority: High  . Involuntary commitment [Z04.6]   . Aggressive behavior [F60.89]   . 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 Vitamin D insufficiency [E55.9] 12/30/2012  . Loss of weight [R63.4] 12/30/2012  . Abnormal weight loss [R63.4] 12/21/2012  . Intellectual disability [F79] 11/03/2012  . ADHD (attention deficit hyperactivity disorder), combined type [F90.2] 11/03/2012  . 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 Environmental allergies [Z91.048] 11/03/2012  . 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 Other convulsions [R56.9] 10/05/2012  . Essential and other specified forms of tremor [G25.0, G25.2] 10/05/2012  . Unspecified mental or behavioral problem [IMO0002] 10/05/2012  . 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   HENT: Negative.   Eyes: Negative.   Respiratory: Negative.   Cardiovascular: Negative.   Gastrointestinal: Negative.    Genitourinary: Negative.   Musculoskeletal: Negative.   Skin: Negative.   Neurological: Negative.   Endo/Heme/Allergies: Negative.   Psychiatric/Behavioral: Negative.     Blood pressure 107/66, pulse 88, temperature 98.6 F (37 C), temperature source Oral, resp. rate 16, height 6\' 3"  (1.905 m), weight 68.04 kg (150 lb), SpO2 99 %.Body mass index is 18.75 kg/(m^2).  General Appearance: Casual  Eye Contact::  Good  Speech:  Normal Rate  Volume:  Normal  Mood:  Euthymic  Affect:  Congruent  Thought Process:  Coherent  Orientation:  Full (Time, Place, and Person)  Thought Content:  WDL  Suicidal Thoughts:  No  Homicidal Thoughts:  No  Memory:  Immediate;   Good Recent;   Good Remote;   Good  Judgement:  Fair  Insight:  Fair  Psychomotor Activity:  Normal  Concentration:  Good  Recall:  Good  Fund of Knowledge:Fair  Language: Good  Akathisia:  No  Handed:  Right  AIMS (if indicated):     Assets:  Housing Leisure Time Physical Health Resilience Social Support  ADL's:  Intact  Cognition: WNL  Sleep:      Mental Status Per Nursing Assessment::   On Admission:    suicidal ideations  Demographic Factors:  Male and Adolescent or young adult  Loss Factors: NA  Historical Factors: NA  Risk Reduction Factors:   Sense of responsibility to family, Living with another person, especially a relative, Positive social support and Positive therapeutic relationship  Continued Clinical Symptoms:  None  Cognitive Features That Contribute To Risk:  None    Suicide Risk:  Minimal: No identifiable suicidal ideation.  Patients presenting with no risk factors but with morbid ruminations; may be classified as minimal risk  based on the severity of the depressive symptoms    Plan Of Care/Follow-up recommendations:  Activity:  as tolerated Diet:  heart healthy deit  Vianny Schraeder, NP 10/21/2015, 5:23 PM

## 2015-10-21 NOTE — Consult Note (Signed)
[facility] Consult   Reason for Consult:  Suicidal ideations Referring Physician:  EDP Patient Identification: Geoffrey [name] MRN:  [id number] Principal Diagnosis: Adjustment disorder with mixed disturbance of emotions and conduct Diagnosis:   Patient Active Problem List   Diagnosis Date Noted  . Adjustment disorder with mixed disturbance of emotions and conduct [F43.25] 10/21/2015    Priority: High  . Aggression [F60.89] 10/02/2014    Priority: High  . Autism [F84.0] 10/02/2014    Priority: High  . Post traumatic stress disorder (PTSD) [F43.10] 05/20/2013    Priority: High  . Excessive anger [F91.1] 05/20/2013    Priority: High  . ADD (attention deficit disorder) [F90.9] 05/20/2013    Priority: High  . Involuntary commitment [Z04.6]   . Aggressive behavior [F60.89]   . 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 Vitamin D insufficiency [E55.9] 12/30/2012  . Loss of weight [R63.4] 12/30/2012  . Abnormal weight loss [R63.4] 12/21/2012  . Intellectual disability [F79] 11/03/2012  . ADHD (attention deficit hyperactivity disorder), combined type [F90.2] 11/03/2012  . 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 Environmental allergies [Z91.048] 11/03/2012  . 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 Other convulsions [R56.9] 10/05/2012  . Essential and other specified forms of tremor [G25.0, G25.2] 10/05/2012  . Unspecified mental or behavioral problem [IMO0002] 10/05/2012  . 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 male patient does not warrant admission.  HPI:  20 yo male who presented to the ED after said he wanted to hurt himself if he had to go back to live with his foster dad.  Denies suicidal ideations today along  with homicidal ideations, hallucinations,and alcohol/drug abuse.  Nor does he want to light anything on fire. Stable for discharge.  Past Psychiatric History: ADHD, Conduct d/o  Risk to Self: Suicidal Ideation: Yes-Currently Present Suicidal Intent: No Is patient at risk for suicide?: No Suicidal Plan?: No Access to Means: No What has been your use of drugs/alcohol within the last 12 months?: None Reported Other Self Harm Risks: h/o aspergers per chart Intentional Self Injurious Behavior: Cutting Comment - Self Injurious Behavior: Pt reports h/o cutting and states "I don't remember" when asked last time Risk to Others: Homicidal Ideation: No Thoughts of Harm to Others: No Current Homicidal Intent: No Current Homicidal Plan: No Access to Homicidal Means: No History of harm to others?: No Assessment of Violence: None Noted Does patient have access to weapons?: No Criminal Charges Pending?: No Does patient have a court date: No Prior Inpatient Therapy: Prior Inpatient Therapy: No Prior Outpatient Therapy: Prior Outpatient Therapy: No Does patient have an ACCT team?: No Does patient have Intensive In-House Services?  : No Does patient have [facility] services? : Unknown Does patient have P4CC services?: No  Past Medical History:  Past Medical History  Diagnosis Date  . Seizures ([place])   . Aggressive behavior   . Conduct disorder   . ADHD (attention deficit hyperactivity disorder)   . Delay in development   . Depression     Past Surgical History  Procedure Laterality Date  . No past surgeries     Family History:  Family History  Problem Relation Age of Onset  . Epilepsy Father   . Cerebral palsy Brother   . Healthy Brother   . Asthma  Other   . Cancer Other   . Diabetes Other   . Stroke Other   . COPD Other   . Heart attack Other    Family Psychiatric  History: none Social History:  History  Alcohol Use  . Yes    Comment: Patient denies      History  Drug Use   . Yes    Comment: Patient denies     Social History   Social History  . Marital Status: Single    Spouse Name: N/A  . Number of Children: N/A  . Years of Education: N/A   Social History Main Topics  . Smoking status: Former Research scientist (life sciences)  . Smokeless tobacco: Never Used  . Alcohol Use: Yes     Comment: Patient denies   . Drug Use: Yes     Comment: Patient denies   . Sexual Activity: No   Other Topics Concern  . None   Social History Narrative   Additional Social History:    Allergies:  No Known Allergies  Labs:  Results for orders placed or performed during the hospital encounter of 10/20/15 (from the past 48 hour(s))  Urine rapid drug screen (hosp performed)not at [facility]     Status: None   Collection Time: 10/20/15 11:53 PM  Result Value Ref Range   Opiates NONE DETECTED NONE DETECTED   Cocaine NONE DETECTED NONE DETECTED   Benzodiazepines NONE DETECTED NONE DETECTED   Amphetamines NONE DETECTED NONE DETECTED   Tetrahydrocannabinol NONE DETECTED NONE DETECTED   Barbiturates NONE DETECTED NONE DETECTED    Comment:        DRUG SCREEN FOR MEDICAL PURPOSES ONLY.  IF CONFIRMATION IS NEEDED FOR ANY PURPOSE, NOTIFY LAB WITHIN 5 DAYS.        LOWEST DETECTABLE LIMITS FOR URINE DRUG SCREEN Drug Class       Cutoff (ng/mL) Amphetamine      1000 Barbiturate      200 Benzodiazepine   048 Tricyclics       889 Opiates          300 Cocaine          300 THC              50   Comprehensive metabolic panel     Status: Abnormal   Collection Time: 10/21/15 12:24 AM  Result Value Ref Range   Sodium 138 135 - 145 mmol/L   Potassium 3.5 3.5 - 5.1 mmol/L   Chloride 105 101 - 111 mmol/L   CO2 26 22 - 32 mmol/L   Glucose, Bld 107 (H) 65 - 99 mg/dL   BUN 15 6 - 20 mg/dL   Creatinine, Ser 1.19 0.61 - 1.24 mg/dL   Calcium 8.9 8.9 - 10.3 mg/dL   Total Protein 7.4 6.5 - 8.1 g/dL   Albumin 4.2 3.5 - 5.0 g/dL   AST 26 15 - 41 U/L   ALT 19 17 - 63 U/L   Alkaline Phosphatase 59 38 - 126  U/L   Total Bilirubin 0.4 0.3 - 1.2 mg/dL   GFR calc non Af Amer >60 >60 mL/min   GFR calc Af Amer >60 >60 mL/min    Comment: (NOTE) The eGFR has been calculated using the CKD EPI equation. This calculation has not been validated in all clinical situations. eGFR's persistently <60 mL/min signify possible Chronic Kidney Disease.    Anion gap 7 5 - 15  Ethanol     Status: None   Collection Time:  10/21/15 12:24 AM  Result Value Ref Range   Alcohol, Ethyl (B) <5 <5 mg/dL    Comment:        LOWEST DETECTABLE LIMIT FOR SERUM ALCOHOL IS 5 mg/dL FOR MEDICAL PURPOSES ONLY   CBC with Diff     Status: None   Collection Time: 10/21/15 12:24 AM  Result Value Ref Range   WBC 6.5 4.0 - 10.5 K/uL   RBC 4.44 4.22 - 5.81 MIL/uL   Hemoglobin 13.8 13.0 - 17.0 g/dL   HCT 39.9 39.0 - 52.0 %   MCV 89.9 78.0 - 100.0 fL   MCH 31.1 26.0 - 34.0 pg   MCHC 34.6 30.0 - 36.0 g/dL   RDW 12.8 11.5 - 15.5 %   Platelets 197 150 - 400 K/uL   Neutrophils Relative % 70 %   Neutro Abs 4.6 1.7 - 7.7 K/uL   Lymphocytes Relative 22 %   Lymphs Abs 1.4 0.7 - 4.0 K/uL   Monocytes Relative 8 %   Monocytes Absolute 0.5 0.1 - 1.0 K/uL   Eosinophils Relative 0 %   Eosinophils Absolute 0.0 0.0 - 0.7 K/uL   Basophils Relative 0 %   Basophils Absolute 0.0 0.0 - 0.1 K/uL  Salicylate level     Status: None   Collection Time: 10/21/15 12:24 AM  Result Value Ref Range   Salicylate Lvl <1.8 2.8 - 30.0 mg/dL  Acetaminophen level     Status: Abnormal   Collection Time: 10/21/15 12:24 AM  Result Value Ref Range   Acetaminophen (Tylenol), Serum <10 (L) 10 - 30 ug/mL    Comment:        THERAPEUTIC CONCENTRATIONS VARY SIGNIFICANTLY. A RANGE OF 10-30 ug/mL MAY BE AN EFFECTIVE CONCENTRATION FOR MANY PATIENTS. HOWEVER, SOME ARE BEST TREATED AT CONCENTRATIONS OUTSIDE THIS RANGE. ACETAMINOPHEN CONCENTRATIONS >150 ug/mL AT 4 HOURS AFTER INGESTION AND >50 ug/mL AT 12 HOURS AFTER INGESTION ARE OFTEN ASSOCIATED WITH  TOXIC REACTIONS.     Current Facility-Administered Medications  Medication Dose Route Frequency Provider Last Rate Last Dose  . acetaminophen (TYLENOL) tablet 650 mg  650 mg Oral Q4H PRN Mercedes Camprubi-Soms, PA-C      . alum & mag hydroxide-simeth (MAALOX/MYLANTA) 200-200-20 MG/5ML suspension 30 mL  30 mL Oral PRN Mercedes Camprubi-Soms, PA-C      . carbamazepine (CARBATROL) 12 hr capsule 300 mg  300 mg Oral BID Mercedes Camprubi-Soms, PA-C   300 mg at 10/21/15 1045  . cloNIDine (CATAPRES) tablet 0.1 mg  0.1 mg Oral BID Mercedes Camprubi-Soms, PA-C   0.1 mg at 10/21/15 1043  . hydrOXYzine (ATARAX/VISTARIL) tablet 75 mg  75 mg Oral QHS Mercedes Camprubi-Soms, PA-C   75 mg at 10/21/15 0141  . ibuprofen (ADVIL,MOTRIN) tablet 600 mg  600 mg Oral Q8H PRN Mercedes Camprubi-Soms, PA-C      . LORazepam (ATIVAN) tablet 1 mg  1 mg Oral Q8H PRN Mercedes Camprubi-Soms, PA-C      . methylphenidate (CONCERTA) CR tablet 54 mg  54 mg Oral q morning - 10a Mercedes Camprubi-Soms, PA-C   54 mg at 10/21/15 1042  . nicotine (NICODERM CQ - dosed in mg/24 hours) patch 21 mg  21 mg Transdermal Daily Mercedes Camprubi-Soms, PA-C   21 mg at 10/21/15 1043  . ondansetron (ZOFRAN) tablet 4 mg  4 mg Oral Q8H PRN Mercedes Camprubi-Soms, PA-C      . Oxcarbazepine (TRILEPTAL) tablet 300 mg  300 mg Oral BID Mercedes Camprubi-Soms, PA-C   300 mg at 10/21/15 1042  .  sertraline (ZOLOFT) tablet 50 mg  50 mg Oral Daily Mercedes Camprubi-Soms, PA-C   50 mg at 10/21/15 1043  . zolpidem (AMBIEN) tablet 5 mg  5 mg Oral QHS PRN Mercedes Camprubi-Soms, PA-C   5 mg at 10/21/15 0141   Current Outpatient Prescriptions  Medication Sig Dispense Refill  . CARBATROL 300 MG 12 hr capsule Take 300 mg by mouth 2 (two) times daily.  1  . cloNIDine (CATAPRES) 0.1 MG tablet Take 1 tablet (0.1 mg total) by mouth 2 (two) times daily. 60 tablet 0  . hydrOXYzine (ATARAX/VISTARIL) 50 MG tablet Take 75 mg by mouth at bedtime.  1  . methylphenidate 54  MG PO CR tablet Take 54 mg by mouth every morning.  0  . Oxcarbazepine (TRILEPTAL) 300 MG tablet Take 300 mg by mouth 2 (two) times daily.  0  . sertraline (ZOLOFT) 50 MG tablet Take 50 mg by mouth daily.  1    Musculoskeletal: Strength & Muscle Tone: within normal limits Gait & Station: normal Patient leans: N/A  Psychiatric Specialty Exam: Review of Systems  Constitutional: Negative.   HENT: Negative.   Eyes: Negative.   Respiratory: Negative.   Cardiovascular: Negative.   Gastrointestinal: Negative.   Genitourinary: Negative.   Musculoskeletal: Negative.   Skin: Negative.   Neurological: Negative.   Endo/Heme/Allergies: Negative.   Psychiatric/Behavioral: Negative.     Blood pressure 107/66, pulse 88, temperature 98.6 F (37 C), temperature source Oral, resp. rate 16, height _0  (1.905 m), weight 68.04 kg (150 lb), SpO2 99 %.Body mass index is 18.75 kg/(m^2).  General Appearance: Casual  Eye Contact::  Good  Speech:  Normal Rate  Volume:  Normal  Mood:  Euthymic  Affect:  Congruent  Thought Process:  Coherent  Orientation:  Full (Time, Place, and Person)  Thought Content:  WDL  Suicidal Thoughts:  No  Homicidal Thoughts:  No  Memory:  Immediate;   Good Recent;   Good Remote;   Good  Judgement:  Fair  Insight:  Fair  Psychomotor Activity:  Normal  Concentration:  Good  Recall:  Good  Fund of Knowledge:Fair  Language: Good  Akathisia:  No  Handed:  Right  AIMS (if indicated):     Assets:  Housing Leisure Time Physical Health Resilience Social Support  ADL's:  Intact  Cognition: WNL  Sleep:      Treatment Plan Summary: Daily contact with patient to assess and evaluate symptoms and progress in treatment, Medication management and Plan adjustment disorder with mixed emotions and conduct:  -Crisis stabilization -Medication management:  Continue Trileptal 300 mg BID, methylphenidate 50 mg daily for ADHD, Vistaril 75 mg at bedtime for anxiety, Clonidine 0.1  mg BID for ADHD, and Zoloft 50 mg for depression.  Continue medical medications. -Individual counseling  Disposition: No evidence of imminent risk to self or others at present.    Waylan Boga, NP 10/21/2015 11:01 AM Patient seen face-to-face for psychiatric evaluation, chart reviewed and case discussed with the physician extender and developed treatment plan. Reviewed the information documented and agree with the treatment plan. Corena Pilgrim, MD

## 2015-10-26 ENCOUNTER — Encounter (HOSPITAL_COMMUNITY): Payer: Self-pay | Admitting: Emergency Medicine

## 2015-10-26 ENCOUNTER — Emergency Department (HOSPITAL_COMMUNITY)
Admission: EM | Admit: 2015-10-26 | Discharge: 2015-10-27 | Disposition: A | Discharge status: Home / Self Care | Payer: Medicare Other | Attending: Emergency Medicine | Admitting: Emergency Medicine

## 2015-10-26 DIAGNOSIS — Z87891 Personal history of nicotine dependence: Secondary | ICD-10-CM | POA: Insufficient documentation

## 2015-10-26 DIAGNOSIS — F4325 Adjustment disorder with mixed disturbance of emotions and conduct: Secondary | ICD-10-CM | POA: Diagnosis present

## 2015-10-26 DIAGNOSIS — Z79899 Other long term (current) drug therapy: Secondary | ICD-10-CM | POA: Diagnosis not present

## 2015-10-26 DIAGNOSIS — F329 Major depressive disorder, single episode, unspecified: Secondary | ICD-10-CM | POA: Insufficient documentation

## 2015-10-26 DIAGNOSIS — F909 Attention-deficit hyperactivity disorder, unspecified type: Secondary | ICD-10-CM | POA: Diagnosis not present

## 2015-10-26 DIAGNOSIS — F419 Anxiety disorder, unspecified: Secondary | ICD-10-CM | POA: Diagnosis not present

## 2015-10-26 DIAGNOSIS — G40909 Epilepsy, unspecified, not intractable, without status epilepticus: Secondary | ICD-10-CM | POA: Insufficient documentation

## 2015-10-26 DIAGNOSIS — F910 Conduct disorder confined to family context: Secondary | ICD-10-CM | POA: Diagnosis present

## 2015-10-26 DIAGNOSIS — F988 Other specified behavioral and emotional disorders with onset usually occurring in childhood and adolescence: Secondary | ICD-10-CM | POA: Diagnosis present

## 2015-10-26 LAB — COMPREHENSIVE METABOLIC PANEL
ALT: 21 U/L (ref 17–63)
AST: 25 U/L (ref 15–41)
Albumin: 4.2 g/dL (ref 3.5–5.0)
Alkaline Phosphatase: 58 U/L (ref 38–126)
Anion gap: 10 (ref 5–15)
BILIRUBIN TOTAL: 0.8 mg/dL (ref 0.3–1.2)
BUN: 10 mg/dL (ref 6–20)
CALCIUM: 9.3 mg/dL (ref 8.9–10.3)
CO2: 23 mmol/L (ref 22–32)
CREATININE: 0.91 mg/dL (ref 0.61–1.24)
Chloride: 104 mmol/L (ref 101–111)
Glucose, Bld: 92 mg/dL (ref 65–99)
Potassium: 3.9 mmol/L (ref 3.5–5.1)
Sodium: 137 mmol/L (ref 135–145)
TOTAL PROTEIN: 7.4 g/dL (ref 6.5–8.1)

## 2015-10-26 LAB — CBC WITH DIFFERENTIAL/PLATELET
BASOS ABS: 0 10*3/uL (ref 0.0–0.1)
BASOS PCT: 0 %
EOS ABS: 0 10*3/uL (ref 0.0–0.7)
EOS PCT: 0 %
HCT: 41.8 % (ref 39.0–52.0)
HEMOGLOBIN: 14.5 g/dL (ref 13.0–17.0)
Lymphocytes Relative: 16 %
Lymphs Abs: 1.5 10*3/uL (ref 0.7–4.0)
MCH: 30.7 pg (ref 26.0–34.0)
MCHC: 34.7 g/dL (ref 30.0–36.0)
MCV: 88.6 fL (ref 78.0–100.0)
Monocytes Absolute: 0.5 10*3/uL (ref 0.1–1.0)
Monocytes Relative: 5 %
NEUTROS PCT: 79 %
Neutro Abs: 7.7 10*3/uL (ref 1.7–7.7)
PLATELETS: 209 10*3/uL (ref 150–400)
RBC: 4.72 MIL/uL (ref 4.22–5.81)
RDW: 13.1 % (ref 11.5–15.5)
WBC: 9.8 10*3/uL (ref 4.0–10.5)

## 2015-10-26 LAB — ETHANOL: Alcohol, Ethyl (B): <5 mg/dL (ref <5)

## 2015-10-26 MED ORDER — ACETAMINOPHEN 325 MG PO TABS: 650.0000 mg | ORAL_TABLET | ORAL | Status: DC | PRN

## 2015-10-26 MED ORDER — SERTRALINE HCL 50 MG PO TABS
50.0000 mg | ORAL_TABLET | Freq: Every day | ORAL | Status: DC
  Administered 2015-10-27: 50 mg via ORAL
  Filled 2015-10-26: qty 1

## 2015-10-26 MED ORDER — CARBAMAZEPINE ER 100 MG PO TB12
300.0000 mg | ORAL_TABLET | Freq: Two times a day (BID) | ORAL | Status: DC
  Administered 2015-10-26 – 2015-10-27 (×2): 300 mg via ORAL
  Filled 2015-10-26 (×4): qty 3

## 2015-10-26 MED ORDER — METHYLPHENIDATE HCL ER (OSM) 27 MG PO TBCR
54.0000 mg | EXTENDED_RELEASE_TABLET | Freq: Every morning | ORAL | Status: DC
  Administered 2015-10-27: 54 mg via ORAL
  Filled 2015-10-26: qty 2

## 2015-10-26 MED ORDER — NICOTINE 21 MG/24HR TD PT24
21.0000 mg | MEDICATED_PATCH | Freq: Every day | TRANSDERMAL | Status: DC
  Filled 2015-10-26: qty 1

## 2015-10-26 MED ORDER — ALUM & MAG HYDROXIDE-SIMETH 200-200-20 MG/5ML PO SUSP
30.0000 mL | ORAL | Status: DC | PRN
Start: 2015-10-26 — End: 2015-10-27

## 2015-10-26 MED ORDER — OXCARBAZEPINE 300 MG PO TABS
300.0000 mg | ORAL_TABLET | Freq: Two times a day (BID) | ORAL | Status: DC
  Administered 2015-10-26 – 2015-10-27 (×2): 300 mg via ORAL
  Filled 2015-10-26 (×2): qty 1

## 2015-10-26 MED ORDER — HYDROXYZINE HCL 25 MG PO TABS
75.0000 mg | ORAL_TABLET | Freq: Every day | ORAL | Status: DC
  Administered 2015-10-26: 75 mg via ORAL
  Filled 2015-10-26: qty 3

## 2015-10-26 MED ORDER — CLONIDINE HCL 0.1 MG PO TABS
0.1000 mg | ORAL_TABLET | Freq: Two times a day (BID) | ORAL | Status: DC
  Administered 2015-10-26 – 2015-10-27 (×2): 0.1 mg via ORAL
  Filled 2015-10-26 (×2): qty 1

## 2015-10-26 MED ORDER — IBUPROFEN 200 MG PO TABS: 600.0000 mg | ORAL_TABLET | Freq: Three times a day (TID) | ORAL | Status: DC | PRN

## 2015-10-26 MED ORDER — ONDANSETRON HCL 4 MG PO TABS: 4.0000 mg | ORAL_TABLET | Freq: Three times a day (TID) | ORAL | Status: DC | PRN
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
## 2015-10-26 NOTE — ED Notes (Signed)
Patient mother and legal guardian:  Ms Mayford KnifeWilliams [phone]
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
## 2015-10-26 NOTE — ED Notes (Signed)
Bed: WA29 Expected date:  Expected time:  Means of arrival:  Comments: 

## 2015-10-26 NOTE — BH Assessment (Signed)
Consulted with Maryjean Mornharles Kober, PA-C who recommends patient be observed overnight and evaluated in the morning by psychiatry to uphold or rescind IVC.   Davina PokeJoVea Cortlyn Cannell, LCSW Therapeutic Triage Specialist [facility] 10/26/2015 9:12 PM
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
## 2015-10-27 DIAGNOSIS — F4325 Adjustment disorder with mixed disturbance of emotions and conduct: Secondary | ICD-10-CM

## 2015-10-27 DIAGNOSIS — F909 Attention-deficit hyperactivity disorder, unspecified type: Secondary | ICD-10-CM | POA: Diagnosis not present

## 2015-10-27 DIAGNOSIS — F419 Anxiety disorder, unspecified: Secondary | ICD-10-CM | POA: Diagnosis not present

## 2015-10-27 LAB — RAPID URINE DRUG SCREEN, HOSP PERFORMED
Amphetamines: NOT DETECTED
Barbiturates: NOT DETECTED
Benzodiazepines: NOT DETECTED
Cocaine: NOT DETECTED
OPIATES: NOT DETECTED
Tetrahydrocannabinol: NOT DETECTED
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
## 2015-10-27 NOTE — ED Notes (Signed)
Discussed discharge instructions with patient. Patient leaving with foster parent Bryson HaLamont Harmon, with all belongings to go home in private vehicle.  Bryson HaLamont Harmon was ID 'd with driver's license and phone was given to him per request of his mother Ms Mayford KnifeWilliams.

## 2015-10-27 NOTE — BHH Counselor (Signed)
[name] is in one of there therapeutic foster homes. [facility] will not provide foster care home contact information and states that if Pt is D/C the Pt will need to be D/C to his mother.   Geoffrey PhoenixBrandi Kynzli Rease, [facility] Triage Specialist
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
## 2015-10-27 NOTE — Clinical Social Work Note (Signed)
CSW provided rescinding IVC paperwork per psychiatry request.  Forms signed and filed.  Pt is ready for discharge.  Pt's mother has been called.  Elray Buba.Aribella Vavra, LCSW [facility] Clinical Social Worker - Weekend Coverage cell #: [phone]
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
## 2019-01-23 ENCOUNTER — Telehealth: Payer: Self-pay | Admitting: Neurology

## 2019-01-23 ENCOUNTER — Encounter: Payer: Self-pay | Admitting: Neurology

## 2019-01-23 ENCOUNTER — Other Ambulatory Visit: Payer: Self-pay

## 2019-01-23 ENCOUNTER — Ambulatory Visit (INDEPENDENT_AMBULATORY_CARE_PROVIDER_SITE_OTHER): Payer: Medicare Other | Admitting: Neurology

## 2019-01-23 VITALS — BP 121/74 | HR 84 | Temp 98.0°F | Ht 72.0 in | Wt 183.0 lb

## 2019-01-23 DIAGNOSIS — R251 Tremor, unspecified: Secondary | ICD-10-CM | POA: Insufficient documentation

## 2019-01-23 DIAGNOSIS — Z79899 Other long term (current) drug therapy: Secondary | ICD-10-CM | POA: Insufficient documentation

## 2019-01-23 DIAGNOSIS — R569 Unspecified convulsions: Secondary | ICD-10-CM | POA: Diagnosis not present

## 2019-01-23 MED ORDER — CARBAMAZEPINE ER 300 MG PO CP12: 300.0000 mg | ORAL_CAPSULE | Freq: Two times a day (BID) | ORAL | 11 refills | Status: DC

## 2019-01-23 MED ORDER — DIVALPROEX SODIUM ER 500 MG PO TB24
500.0000 mg | ORAL_TABLET | Freq: Every day | ORAL | 11 refills | Status: DC
Start: 2019-01-23 — End: 2019-05-03
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
## 2019-01-23 NOTE — Telephone Encounter (Signed)
medicare/medicaid order sent to [facility]. No auth they will reach out to the patient to schedule.  °

## 2019-01-25 ENCOUNTER — Ambulatory Visit (INDEPENDENT_AMBULATORY_CARE_PROVIDER_SITE_OTHER): Payer: Medicare Other | Admitting: Neurology

## 2019-01-25 ENCOUNTER — Other Ambulatory Visit: Payer: Self-pay

## 2019-01-25 DIAGNOSIS — R251 Tremor, unspecified: Secondary | ICD-10-CM

## 2019-01-25 DIAGNOSIS — R569 Unspecified convulsions: Secondary | ICD-10-CM | POA: Diagnosis not present

## 2019-01-25 DIAGNOSIS — Z79899 Other long term (current) drug therapy: Secondary | ICD-10-CM

## 2019-01-28 NOTE — Procedures (Signed)
   HISTORY: 23 years old male, had a history of seizure  TECHNIQUE:  This is a routine 16 channel EEG recording with one channel devoted to a limited EKG recording.  It was performed during wakefulness, drowsiness and asleep.  Hyperventilation and photic stimulation were performed as activating procedures.  There are minimum muscle and movement artifact noted.  Upon maximum arousal, posterior dominant waking rhythm consistent of rhythmic alpha range activity, with frequency of 9 Hz. Activities are symmetric over the bilateral posterior derivations and attenuated with eye opening.  Hyperventilation produced mild/moderate buildup with higher amplitude and the slower activities noted.  Photic stimulation did not alter the tracing.  During EEG recording, patient developed drowsiness and entered sleep, sleep EEG demonstrated architecture, there were frontal centrally dominant vertex waves and symmetric sleep spindles noted.  During EEG recording, there was no epileptiform discharge noted.  EKG demonstrate sinus rhythm, with heart rate of 78 beats per minute.  CONCLUSION: This is a  normal awake and asleep EEG.  There is no electrodiagnostic evidence of epileptiform discharge.  Marcial Pacas, M.D. Ph.D.  [facility] [place], [place] [address] Phone: [phone] Fax:      [phone]

## 2019-01-29 ENCOUNTER — Emergency Department (HOSPITAL_COMMUNITY)
Admission: EM | Admit: 2019-01-29 | Discharge: 2019-01-29 | Disposition: A | Discharge status: Home / Self Care | Payer: Medicare Other | Attending: Emergency Medicine | Admitting: Emergency Medicine

## 2019-01-29 ENCOUNTER — Other Ambulatory Visit: Payer: Self-pay

## 2019-01-29 ENCOUNTER — Encounter (HOSPITAL_COMMUNITY): Payer: Self-pay | Admitting: *Deleted

## 2019-01-29 DIAGNOSIS — Z046 Encounter for general psychiatric examination, requested by authority: Secondary | ICD-10-CM | POA: Diagnosis present

## 2019-01-29 DIAGNOSIS — F919 Conduct disorder, unspecified: Secondary | ICD-10-CM | POA: Insufficient documentation

## 2019-01-29 DIAGNOSIS — F329 Major depressive disorder, single episode, unspecified: Secondary | ICD-10-CM | POA: Insufficient documentation

## 2019-01-29 DIAGNOSIS — R456 Violent behavior: Secondary | ICD-10-CM | POA: Diagnosis not present

## 2019-01-29 DIAGNOSIS — Z79899 Other long term (current) drug therapy: Secondary | ICD-10-CM | POA: Insufficient documentation

## 2019-01-29 DIAGNOSIS — F17210 Nicotine dependence, cigarettes, uncomplicated: Secondary | ICD-10-CM | POA: Insufficient documentation

## 2019-01-29 DIAGNOSIS — F840 Autistic disorder: Secondary | ICD-10-CM | POA: Diagnosis not present

## 2019-01-29 DIAGNOSIS — G40909 Epilepsy, unspecified, not intractable, without status epilepticus: Secondary | ICD-10-CM | POA: Insufficient documentation

## 2019-01-29 DIAGNOSIS — F333 Major depressive disorder, recurrent, severe with psychotic symptoms: Secondary | ICD-10-CM

## 2019-01-29 DIAGNOSIS — F909 Attention-deficit hyperactivity disorder, unspecified type: Secondary | ICD-10-CM

## 2019-01-29 DIAGNOSIS — R4689 Other symptoms and signs involving appearance and behavior: Secondary | ICD-10-CM | POA: Insufficient documentation

## 2019-01-29 LAB — CBC
HCT: 42.3 % (ref 39.0–52.0)
Hemoglobin: 14.2 g/dL (ref 13.0–17.0)
MCH: 32.4 pg (ref 26.0–34.0)
MCHC: 33.6 g/dL (ref 30.0–36.0)
MCV: 96.6 fL (ref 80.0–100.0)
Platelets: 182 10*3/uL (ref 150–400)
RBC: 4.38 MIL/uL (ref 4.22–5.81)
RDW: 11.9 % (ref 11.5–15.5)
WBC: 5.9 10*3/uL (ref 4.0–10.5)
nRBC: 0 % (ref 0.0–0.2)

## 2019-01-29 LAB — RAPID URINE DRUG SCREEN, HOSP PERFORMED
Amphetamines: POSITIVE — AB
Barbiturates: NOT DETECTED
Benzodiazepines: NOT DETECTED
Cocaine: NOT DETECTED
Opiates: NOT DETECTED
Tetrahydrocannabinol: NOT DETECTED

## 2019-01-29 LAB — ETHANOL: Alcohol, Ethyl (B): <10 mg/dL (ref <10)

## 2019-01-29 LAB — ACETAMINOPHEN LEVEL: Acetaminophen (Tylenol), Serum: <10 ug/mL — ABNORMAL LOW (ref 10–30)

## 2019-01-29 LAB — COMPREHENSIVE METABOLIC PANEL
ALT: 31 U/L (ref 0–44)
AST: 27 U/L (ref 15–41)
Albumin: 4 g/dL (ref 3.5–5.0)
Alkaline Phosphatase: 37 U/L — ABNORMAL LOW (ref 38–126)
Anion gap: 12 (ref 5–15)
BUN: 13 mg/dL (ref 6–20)
CO2: 23 mmol/L (ref 22–32)
Calcium: 9 mg/dL (ref 8.9–10.3)
Chloride: 105 mmol/L (ref 98–111)
Creatinine, Ser: 1.13 mg/dL (ref 0.61–1.24)
GFR calc Af Amer: >60 mL/min (ref >60)
GFR calc non Af Amer: >60 mL/min (ref >60)
Glucose, Bld: 121 mg/dL — ABNORMAL HIGH (ref 70–99)
Potassium: 4.1 mmol/L (ref 3.5–5.1)
Sodium: 140 mmol/L (ref 135–145)
Total Bilirubin: 0.6 mg/dL (ref 0.3–1.2)
Total Protein: 6.8 g/dL (ref 6.5–8.1)

## 2019-01-29 LAB — SALICYLATE LEVEL: Salicylate Lvl: <7 mg/dL (ref 2.8–30.0)

## 2019-01-29 MED ORDER — AMPHETAMINE-DEXTROAMPHETAMINE 10 MG PO TABS
20.0000 mg | ORAL_TABLET | Freq: Every day | ORAL | Status: DC
  Filled 2019-01-29: qty 2
  Filled 2019-01-29: qty 1

## 2019-01-29 MED ORDER — ONDANSETRON 4 MG PO TBDP: 4.0000 mg | ORAL_TABLET | Freq: Three times a day (TID) | ORAL | Status: DC | PRN

## 2019-01-29 MED ORDER — RISPERIDONE 2 MG PO TABS
2.0000 mg | ORAL_TABLET | Freq: Every day | ORAL | Status: DC
  Filled 2019-01-29 (×3): qty 1

## 2019-01-29 MED ORDER — CARBAMAZEPINE ER 300 MG PO CP12: 300.0000 mg | ORAL_CAPSULE | Freq: Two times a day (BID) | ORAL | Status: DC

## 2019-01-29 MED ORDER — ACETAMINOPHEN 325 MG PO TABS: 650.0000 mg | ORAL_TABLET | ORAL | Status: DC | PRN

## 2019-01-29 MED ORDER — VITAMIN D 25 MCG (1000 UNIT) PO TABS: 1000.0000 [IU] | ORAL_TABLET | Freq: Every day | ORAL | Status: DC

## 2019-01-29 MED ORDER — ALUM & MAG HYDROXIDE-SIMETH 200-200-20 MG/5ML PO SUSP: 30.0000 mL | ORAL | Status: DC | PRN

## 2019-01-29 MED ORDER — DIVALPROEX SODIUM ER 500 MG PO TB24
500.0000 mg | ORAL_TABLET | Freq: Every day | ORAL | Status: DC
  Filled 2019-01-29: qty 1

## 2019-01-29 MED ORDER — CLONIDINE HCL 0.1 MG PO TABS
0.1000 mg | ORAL_TABLET | Freq: Two times a day (BID) | ORAL | Status: DC
  Filled 2019-01-29: qty 1

## 2019-01-29 MED ORDER — CARBAMAZEPINE ER 200 MG PO TB12
300.0000 mg | ORAL_TABLET | Freq: Two times a day (BID) | ORAL | Status: DC
  Filled 2019-01-29 (×4): qty 1

## 2019-01-29 MED ORDER — NICOTINE 7 MG/24HR TD PT24
7.0000 mg | MEDICATED_PATCH | Freq: Once | TRANSDERMAL | Status: DC
  Filled 2019-01-29: qty 1

## 2019-01-29 NOTE — ED Notes (Signed)
Patient is able to state that he is here to keep himself and everyone around him safe. Denies SI and HI.

## 2019-01-29 NOTE — ED Notes (Signed)
TTS at bedside. 

## 2019-01-29 NOTE — BH Assessment (Signed)
Tele Assessment Note   Patient Name: Geoffrey [name] MRN: [id number] Referring Physician:  Location of Patient:  Location of Provider:     Patient is 23 years old male.  Patient was brought to the ED by the police under IVC. Patient has a legal guardian     Per IVC, patient attempted to fight his step father.  Patient reports that he had thoughts of wanting to harm his family.  Patient has had numerous inpatient psychiatric hospitalizations.   Patient has attempted to invite strangers to his mother's home because he feels as if they are his, "good friends".    Patient reports that he is diagnosed with Autism and Major Depressive Disorder.  Patient has taken his medication in 3 days.  He was previously in a [facility] home.  He moved back home I July 2020.    Patient denies SI and substance abuse.  Patient is a poor historian.  During the assessment patient reports that he has 2 children living in [place] and [place].   Collateral information obtained by his mother (legal guardian) reports that the patient will contact strangers ad invite them to his mothers home.       Diagnosis: Major Depressive Disorder andf Autism   Past Medical History:  Past Medical History:  Diagnosis Date  . ADHD (attention deficit hyperactivity disorder)   . Aggressive behavior   . Autism   . Conduct disorder   . Delay in development   . Depression   . Epilepsy (HCC)   . Essential tremor   . 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 Nicotine dependence   . Seizures (HCC)     Past Surgical History:  Procedure Laterality Date  . NO PAST SURGERIES      Family History:  Family History  Problem Relation Age of Onset  . Epilepsy Father   . Cerebral palsy Brother   . Healthy Brother   . Asthma Other   . Cancer Other   . Diabetes Other   . Stroke Other   . COPD Other   . Heart attack Other     Social History:  reports that he has been smoking cigars. He has smoked for the past 2.00 years. He has never used  smokeless tobacco. He reports current alcohol use. He reports current drug use.  Additional Social History:  Alcohol / Drug Use History of alcohol / drug use?: No history of alcohol / drug abuse  CIWA: CIWA-Ar BP: 127/79 Pulse Rate: 90 COWS:    Allergies: No Known Allergies  Home Medications: (Not in a hospital admission)   OB/GYN Status:  No LMP for male patient.  General Assessment Data [facility] Assessment: In system Is this a Tele or Face-to-Face Assessment?: Tele Assessment Is this an Initial Assessment or a Re-assessment for this encounter?: Initial Assessment Patient Accompanied by:: Surveyor, quantity(Police) Language Other than English: No Living Arrangements: Other (Comment) What gender do you identify as?: Male Marital status: Single Living Arrangements: Other (Comment) Can pt return to current living arrangement?: Yes Admission Status: Involuntary Petitioner: Other Is patient capable of signing voluntary admission?: No Referral Source: Self/Family/Friend Insurance type: Medicaid     Crisis Care Plan Living Arrangements: Other (Comment)  Education Status Is patient currently in school?: No Is the patient employed, unemployed or receiving disability?: Receiving disability income  Risk to self with the past 6 months Suicidal Ideation: No Has patient been a risk to self within the past 6 months prior to admission? :  No Suicidal Intent: No Has patient had any suicidal intent within the past 6 months prior to admission? : No Is patient at risk for suicide?: No Suicidal Plan?: No Has patient had any suicidal plan within the past 6 months prior to admission? : No Access to Means: No What has been your use of drugs/alcohol within the last 12 months?: (na) Previous Attempts/Gestures: No How many times?: (0) Other Self Harm Risks: n(na) Triggers for Past Attempts: None known Intentional Self Injurious Behavior: None Family Suicide History: No Recent  stressful life event(s): (Strained relaioship with his parents ) Persecutory voices/beliefs?: No Depression: Yes Depression Symptoms: Despondent, Loss of interest in usual pleasures Substance abuse history and/or treatment for substance abuse?: No Suicide prevention information given to non-admitted patients: Not applicable  Risk to Others within the past 6 months Homicidal Ideation: No Does patient have any lifetime risk of violence toward others beyond the six months prior to admission? : No Thoughts of Harm to Others: No Current Homicidal Intent: No Current Homicidal Plan: No Access to Homicidal Means: (na) Identified Victim: na History of harm to others?: No Assessment of Violence: None Noted Violent Behavior Description: na Does patient have access to weapons?: No Criminal Charges Pending?: No Does patient have a court date: No Is patient on probation?: No  Psychosis Hallucinations: None noted Delusions: None noted  Mental Status Report Appearance/Hygiene: Disheveled Eye Contact: Poor Motor Activity: Freedom of movement Speech: Soft, Slow Level of Consciousness: Alert Mood: Depressed, Anxious, Worthless, low self-esteem Affect: Blunted, Depressed Anxiety Level: Minimal Thought Processes: Flight of Ideas Judgement: Unimpaired Orientation: Person, Place, Time, Situation Obsessive Compulsive Thoughts/Behaviors: None  Cognitive Functioning Concentration: Poor Memory: Recent Impaired, Remote Impaired Is patient IDD: No Insight: Poor Impulse Control: Poor Appetite: Fair Have you had any weight changes? : No Change Sleep: No Change Total Hours of Sleep: (8) Vegetative Symptoms: Decreased grooming  ADLScreening [facility] Assessment Services) Patient's cognitive ability adequate to safely complete daily activities?: Yes Patient able to express need for assistance with ADLs?: Yes Independently performs ADLs?: Yes (appropriate for developmental age)  Prior Inpatient  Therapy Prior Inpatient Therapy: No  Prior Outpatient Therapy Prior Outpatient Therapy: Yes Prior Therapy Dates: Ongoing Prior Therapy Facilty/Provider(s): Neurologist Reason for Treatment: Medication Management  Does patient have an ACCT team?: No Does patient have Intensive In-House Services?  : No Does patient have [facility] services? : No Does patient have P4CC services?: No  ADL Screening (condition at time of admission) Patient's cognitive ability adequate to safely complete daily activities?: Yes Patient able to express need for assistance with ADLs?: Yes Independently performs ADLs?: Yes (appropriate for developmental age)                        Disposition: Per Patriciaann Clan, PA - reassess in the morning for a final disposition.    Disposition Initial Assessment Completed for this Encounter: Yes  This service was provided via telemedicine using a 2-way, interactive audio and video technology.  Names of all persons participating in this telemedicine service and their role in this encounter. Name: Marco Adelson L. Johnnye Sima  Role: MA, LCAS-A   Graciella Freer La[place] 01/29/2019 4:52 AM

## 2019-01-29 NOTE — ED Provider Notes (Signed)
[facility] EMERGENCY [facility] Provider Note   CSN: [id number] Arrival date & time: 01/29/19  0050    History   Chief Complaint IVC  HPI Geoffrey [name] is a 23 y.o. male.   The history is provided by the patient and the EMS personnel. The history is limited by a developmental delay.  He has history of autism, attention deficit disorder, epilepsy and is brought in under involuntary commitment.  Apparently, he has not been taking his medications for the last several days and became very aggressive towards his stepfather tonight.  In the past, he has also run away in similar situations.  The patient states that he does not feel comfortable when he is around other people.  He denies any homicidal or suicidal thoughts to me, but told triage nurse that he did have thoughts of harming family members.  He denies hallucinations and denies paranoid ideation.  Past Medical History:  Diagnosis Date  . ADHD (attention deficit hyperactivity disorder)   . Aggressive behavior   . Autism   . Conduct disorder   . Delay in development   . Depression   . Epilepsy (HCC)   . Essential tremor   . 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 Nicotine dependence   . Seizures [facility](HCC)     Patient Active Problem List   Diagnosis Date Noted  . Tremor 01/23/2019  . Seizures (HCC) 01/23/2019  . Polypharmacy 01/23/2019  . Adjustment disorder with mixed disturbance of emotions and conduct 10/21/2015  . Involuntary commitment   . Aggression 10/02/2014  . Autism 10/02/2014  . Aggressive behavior   . 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 Post traumatic stress disorder (PTSD) 05/20/2013  . Excessive anger 05/20/2013  . ADD (attention deficit disorder) 05/20/2013  . Vitamin D insufficiency 12/30/2012  . Loss of weight 12/30/2012  . Abnormal weight loss 12/21/2012  . Intellectual disability 11/03/2012  . ADHD (attention deficit hyperactivity disorder), combined type  11/03/2012  . 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 Language disorder involving understanding and expression of language 11/03/2012  . Environmental allergies 11/03/2012  . Delay in development   . Other convulsions 10/05/2012  . Essential and other specified forms of tremor 10/05/2012  . Unspecified mental or behavioral problem 10/05/2012  . 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 NO PAST SURGERIES          Home Medications    Prior to Admission medications   Medication Sig Start Date End Date Taking? Authorizing Provider  amphetamine-dextroamphetamine (ADDERALL) 20 MG tablet Take 20 mg by mouth daily.    [provider]  carbamazepine (CARBATROL) 300 MG 12 hr capsule Take 1 capsule (300 mg total) by mouth 2 (two) times daily. 01/23/19   Levert FeinsteinYan, Yijun, MD  cholecalciferol (VITAMIN D) 25 MCG (1000 UT) tablet Take 1,000 Units by mouth daily.    [provider]  cloNIDine (CATAPRES) 0.1 MG tablet Take 1 tablet (0.1 mg total) by mouth 2 (two) times daily. 10/05/14   Earney Navynuoha, Josephine C, NP  divalproex (DEPAKOTE ER) 500 MG 24 hr tablet Take 1 tablet (500 mg total) by mouth at bedtime. 01/23/19   Levert FeinsteinYan, Yijun, MD  risperiDONE (RISPERDAL) 2 MG tablet Take 2 mg by mouth daily.    [provider]    Family History Family History  Problem Relation Age of Onset  . Epilepsy Father   . Cerebral palsy Brother   .  Healthy Brother   . Asthma Other   . Cancer Other   . Diabetes Other   . Stroke Other   . COPD Other   . Heart attack Other     Social History Social History   Tobacco Use  . Smoking status: Current Every Day Smoker    Years: 2.00    Types: Cigars  . Smokeless tobacco: Never Used  Substance Use Topics  . Alcohol use: Yes    Comment: socially  . Drug use: Yes    Comment: Patient denies      Allergies   Patient has no known allergies.   Review of Systems Review  of Systems  Unable to perform ROS: Psychiatric disorder  All other systems reviewed and are negative.    Physical Exam Updated Vital Signs BP 127/79 (BP Location: Right Arm)   Pulse 90   Temp 98.5 F (36.9 C) (Oral)   Resp 14   Ht 6\' 3"  (1.905 m)   SpO2 98%   BMI 22.87 kg/m   Physical Exam Vitals signs and nursing note reviewed.    23 year old male, resting comfortably and in no acute distress. Vital signs are normal. Oxygen saturation is 98%, which is normal. Head is normocephalic and atraumatic. PERRLA, EOMI. Oropharynx is clear. Neck is nontender and supple without adenopathy or JVD. Back is nontender and there is no CVA tenderness. Lungs are clear without rales, wheezes, or rhonchi. Chest is nontender. Heart has regular rate and rhythm without murmur. Abdomen is soft, flat, nontender without masses or hepatosplenomegaly and peristalsis is normoactive. Extremities have no cyanosis or edema, full range of motion is present. Skin is warm and dry without rash. Neurologic: Awake and alert, speech normal, flat affect, cranial nerves are intact, there are no motor or sensory deficits.  ED Treatments / Results  Labs (all labs ordered are listed, but only abnormal results are displayed) Labs Reviewed  COMPREHENSIVE METABOLIC PANEL - Abnormal; Notable for the following components:      Result Value   Glucose, Bld 121 (*)    Alkaline Phosphatase 37 (*)    All other components within normal limits  ACETAMINOPHEN LEVEL - Abnormal; Notable for the following components:   Acetaminophen (Tylenol), Serum <10 (*)    All other components within normal limits  RAPID URINE DRUG SCREEN, HOSP PERFORMED - Abnormal; Notable for the following components:   Amphetamines POSITIVE (*)    All other components within normal limits  ETHANOL  SALICYLATE LEVEL  CBC    Procedures Procedures   Medications Ordered in ED Medications  amphetamine-dextroamphetamine (ADDERALL) tablet 20 mg (has  no administration in time range)  carbamazepine (CARBATROL) 12 hr capsule 300 mg (has no administration in time range)  cholecalciferol (VITAMIN D) tablet 1,000 Units (has no administration in time range)  cloNIDine (CATAPRES) tablet 0.1 mg (has no administration in time range)  divalproex (DEPAKOTE ER) 24 hr tablet 500 mg (has no administration in time range)  risperiDONE (RISPERDAL) tablet 2 mg (has no administration in time range)  acetaminophen (TYLENOL) tablet 650 mg (has no administration in time range)  ondansetron (ZOFRAN-ODT) disintegrating tablet 4 mg (has no administration in time range)  alum & mag hydroxide-simeth (MAALOX/MYLANTA) 200-200-20 MG/5ML suspension 30 mL (has no administration in time range)  nicotine (NICODERM CQ - dosed in mg/24 hr) patch 7 mg (has no administration in time range)     Initial Impression / Assessment and Plan / ED Course  I have reviewed the  triage vital signs and the nursing notes.  Pertinent lab results that were available during my care of the patient were reviewed by me and considered in my medical decision making (see chart for details).  Patient with autism and history of aggressive behavior who apparently has been noncompliant with his medications.  Will ask for TTS consultation.  Old records are reviewed and he has had ED visits for homicidal ideation and aggressive behavior.  Drug screen is positive for amphetamines, consistent with his known prescriptions.  Other labs are unremarkable.  TTS consultation is appreciated, patient will be held in the ED for psychiatric evaluation in the morning.  Final Clinical Impressions(s) / ED Diagnoses   Final diagnoses:  Aggressive behavior    ED Discharge Orders    None       Dione BoozeGlick, Minerva Bluett, MD 01/29/19 [phone]

## 2019-01-29 NOTE — ED Notes (Signed)
Diet ordered 

## 2019-01-29 NOTE — ED Notes (Signed)
Breakfast ordered 

## 2019-01-29 NOTE — ED Notes (Signed)
Bent, pts mother states she wants to speak with RN or Dr before any decisions are made for the pt
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
## 2019-01-30 ENCOUNTER — Telehealth: Payer: Self-pay | Admitting: *Deleted

## 2019-01-30 NOTE — Telephone Encounter (Signed)
-----   Message from Marcial Pacas, MD sent at 01/30/2019  2:45 PM EDT ----- Please call patient for normal laboratory result

## 2019-01-30 NOTE — Telephone Encounter (Signed)
Left message that labs results are normal.  Provided the office number to call back with any questions.

## 2019-02-07 ENCOUNTER — Encounter (HOSPITAL_COMMUNITY): Payer: Self-pay | Admitting: Emergency Medicine

## 2019-02-07 ENCOUNTER — Ambulatory Visit (HOSPITAL_COMMUNITY)
Admission: AD | Admit: 2019-02-07 | Discharge: 2019-02-07 | Disposition: A | Discharge status: Home / Self Care | Payer: Medicare Other | Attending: Psychiatry | Admitting: Psychiatry

## 2019-02-07 ENCOUNTER — Other Ambulatory Visit: Payer: Self-pay

## 2019-02-07 ENCOUNTER — Emergency Department (HOSPITAL_COMMUNITY)
Admission: EM | Admit: 2019-02-07 | Discharge: 2019-02-09 | Disposition: A | Discharge status: Home / Self Care | Payer: Medicare Other | Attending: Emergency Medicine | Admitting: Emergency Medicine

## 2019-02-07 DIAGNOSIS — Z79899 Other long term (current) drug therapy: Secondary | ICD-10-CM | POA: Diagnosis not present

## 2019-02-07 DIAGNOSIS — F840 Autistic disorder: Secondary | ICD-10-CM | POA: Insufficient documentation

## 2019-02-07 DIAGNOSIS — F909 Attention-deficit hyperactivity disorder, unspecified type: Secondary | ICD-10-CM | POA: Insufficient documentation

## 2019-02-07 DIAGNOSIS — F17210 Nicotine dependence, cigarettes, uncomplicated: Secondary | ICD-10-CM | POA: Diagnosis not present

## 2019-02-07 DIAGNOSIS — F79 Unspecified intellectual disabilities: Secondary | ICD-10-CM

## 2019-02-07 DIAGNOSIS — F902 Attention-deficit hyperactivity disorder, combined type: Secondary | ICD-10-CM | POA: Diagnosis present

## 2019-02-07 DIAGNOSIS — Z046 Encounter for general psychiatric examination, requested by authority: Secondary | ICD-10-CM | POA: Diagnosis present

## 2019-02-07 DIAGNOSIS — F1290 Cannabis use, unspecified, uncomplicated: Secondary | ICD-10-CM | POA: Insufficient documentation

## 2019-02-07 DIAGNOSIS — F69 Unspecified disorder of adult personality and behavior: Secondary | ICD-10-CM | POA: Diagnosis not present

## 2019-02-07 DIAGNOSIS — F4325 Adjustment disorder with mixed disturbance of emotions and conduct: Secondary | ICD-10-CM | POA: Diagnosis present

## 2019-02-07 DIAGNOSIS — Z8669 Personal history of other diseases of the nervous system and sense organs: Secondary | ICD-10-CM | POA: Insufficient documentation

## 2019-02-07 LAB — COMPREHENSIVE METABOLIC PANEL
ALT: 28 U/L (ref 0–44)
AST: 21 U/L (ref 15–41)
Albumin: 4.3 g/dL (ref 3.5–5.0)
Alkaline Phosphatase: 36 U/L — ABNORMAL LOW (ref 38–126)
Anion gap: 9 (ref 5–15)
BUN: 16 mg/dL (ref 6–20)
CO2: 25 mmol/L (ref 22–32)
Calcium: 9.2 mg/dL (ref 8.9–10.3)
Chloride: 106 mmol/L (ref 98–111)
Creatinine, Ser: 1.14 mg/dL (ref 0.61–1.24)
GFR calc Af Amer: >60 mL/min (ref >60)
GFR calc non Af Amer: >60 mL/min (ref >60)
Glucose, Bld: 116 mg/dL — ABNORMAL HIGH (ref 70–99)
Potassium: 3.7 mmol/L (ref 3.5–5.1)
Sodium: 140 mmol/L (ref 135–145)
Total Bilirubin: 0.6 mg/dL (ref 0.3–1.2)
Total Protein: 7.4 g/dL (ref 6.5–8.1)

## 2019-02-07 LAB — SALICYLATE LEVEL: Salicylate Lvl: <7 mg/dL (ref 2.8–30.0)

## 2019-02-07 LAB — RAPID URINE DRUG SCREEN, HOSP PERFORMED
Amphetamines: POSITIVE — AB
Barbiturates: NOT DETECTED
Benzodiazepines: NOT DETECTED
Cocaine: NOT DETECTED
Opiates: NOT DETECTED
Tetrahydrocannabinol: POSITIVE — AB

## 2019-02-07 LAB — CBC
HCT: 41.6 % (ref 39.0–52.0)
Hemoglobin: 14.3 g/dL (ref 13.0–17.0)
MCH: 32.4 pg (ref 26.0–34.0)
MCHC: 34.4 g/dL (ref 30.0–36.0)
MCV: 94.1 fL (ref 80.0–100.0)
Platelets: 187 10*3/uL (ref 150–400)
RBC: 4.42 MIL/uL (ref 4.22–5.81)
RDW: 11.6 % (ref 11.5–15.5)
WBC: 6 10*3/uL (ref 4.0–10.5)
nRBC: 0 % (ref 0.0–0.2)

## 2019-02-07 LAB — ACETAMINOPHEN LEVEL: Acetaminophen (Tylenol), Serum: <10 ug/mL — ABNORMAL LOW (ref 10–30)

## 2019-02-07 LAB — ETHANOL: Alcohol, Ethyl (B): <10 mg/dL (ref <10)
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
## 2019-02-08 ENCOUNTER — Other Ambulatory Visit: Payer: Self-pay | Admitting: Psychiatry

## 2019-02-08 ENCOUNTER — Encounter (HOSPITAL_COMMUNITY): Payer: Self-pay | Admitting: Registered Nurse

## 2019-02-08 NOTE — ED Notes (Signed)
No respiratory or acute distress noted alert and oriented x 3 food tray given and took pt to restroom.
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
## 2019-02-08 NOTE — BHH Counselor (Signed)
Per Anette Riedel, NP, patient meets inpatient criteria. TTS to secure placement. Counselor sought placement for patient at the following facilities: [facility] (no beds), [facility] (declined due to IDD/Autism), [facility] (declined/Unable to program on their unit due to diagnosis), [facility] (declined due to IDD dx's and behavior/no beds).   Counselor initiated Exception/Diversion paperwork for [facility] by completing the packet. Effie Start was contacted and referral completed. [place] [facility] would would like a copy of IQ testing emailed to [email or web address].   Contacted [facility] to obtain an authorization for the Diversion/Exemption paperwork. Counselor was informed by Stanton Kidney that this process could not be initiated until patient has been in the ER for at least 3 days. States that she needs 3 days of nursing notes in addition to the completed packet faxed. Patient has only been in the Emergency Room for 1 day. Mary recommended calling his Care Coordinator 02/09/2019 Annamarie Dawley) [phone] to obtain recommendations about how to move forward with clients treatment/stabiliazation and request a copy of his IQ scores.   Patient's mother asked that she is called before any decisions are made regarding [facility]. She emphasized that she is his guardian and needs to be involved.

## 2019-02-08 NOTE — ED Notes (Signed)
Called pharmacy for med rec  
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
## 2019-02-08 NOTE — Progress Notes (Signed)
Received Geoffrey [name] asleep in his bed in his room with his head covered. The sitter is at the bedside. No acute distress noted. He continued to sleep throughout the night.

## 2019-02-09 DIAGNOSIS — F69 Unspecified disorder of adult personality and behavior: Secondary | ICD-10-CM | POA: Diagnosis not present

## 2019-02-09 DIAGNOSIS — F4325 Adjustment disorder with mixed disturbance of emotions and conduct: Secondary | ICD-10-CM

## 2019-02-09 MED ORDER — AMPHETAMINE-DEXTROAMPHETAMINE 10 MG PO TABS
20.0000 mg | ORAL_TABLET | Freq: Two times a day (BID) | ORAL | Status: DC
  Administered 2019-02-09: 20 mg via ORAL
  Filled 2019-02-09: qty 2

## 2019-02-09 MED ORDER — CLONIDINE HCL 0.1 MG PO TABS
0.1000 mg | ORAL_TABLET | Freq: Two times a day (BID) | ORAL | Status: DC
  Filled 2019-02-09: qty 1

## 2019-02-09 MED ORDER — RISPERIDONE 2 MG PO TABS
2.0000 mg | ORAL_TABLET | Freq: Every day | ORAL | Status: DC
  Administered 2019-02-09: 2 mg via ORAL
  Filled 2019-02-09: qty 1

## 2019-02-09 MED ORDER — CARBAMAZEPINE ER 100 MG PO TB12
300.0000 mg | ORAL_TABLET | Freq: Two times a day (BID) | ORAL | Status: DC
  Administered 2019-02-09: 300 mg via ORAL
  Filled 2019-02-09 (×2): qty 3

## 2019-02-09 MED ORDER — DIVALPROEX SODIUM ER 500 MG PO TB24: 500.0000 mg | ORAL_TABLET | Freq: Every day | ORAL | Status: DC

## 2019-02-09 MED ORDER — VITAMIN D3 25 MCG (1000 UNIT) PO TABS
1000.0000 [IU] | ORAL_TABLET | Freq: Every day | ORAL | Status: DC
  Administered 2019-02-09: 1000 [IU] via ORAL
  Filled 2019-02-09: qty 1

## 2019-02-09 NOTE — Progress Notes (Addendum)
Geoffrey Merl, LCSW completed the Diversion Exception forms for a state hospital referral. Geoffrey [name] is requesting 3 days of nursing notes before an authorization number will be provided. At this time, pt has not been in the hospital for 3 days.   Disposition will continue to follow.   Geoffrey Camel, LCSW, [place] Disposition CSW [facility] BHH/TTS [phone]   UPDATE: CSW spoke with pt's care coordinator, Geoffrey [name] [phone]) and updated him on pt's disposition. He shared that pt moved out of an [facility] home and back into his mother's home last month and that the transition has been, "difficult". Mr. Geoffrey [name] stated that pt's mother (legal guardian) does not want pt to go to a group home or back to [facility]. He will reach out to pt's mother later today.

## 2019-02-09 NOTE — Progress Notes (Signed)
Pt spoke with pt's mother Marga Melnick [phone]) and updated her on pt's disposition. She voiced understanding and stated she will come to [facility] ED to pick pt up after 2pm. She stated that she had already been informed that pt was being discharged and that he would be given his 2pm medications. She will come to [facility] ED after this.   Audree Camel, LCSW, [place] Disposition [facility] BHH/TTS [phone]

## 2019-02-09 NOTE — BHH Suicide Risk Assessment (Cosign Needed)
Suicide Risk Assessment  Discharge Assessment   [facility] Discharge Suicide Risk Assessment   Principal Problem: Adjustment disorder with mixed disturbance of emotions and conduct Discharge Diagnoses: Principal Problem:   Adjustment disorder with mixed disturbance of emotions and conduct Active Problems:   Intellectual disability   ADHD (attention deficit hyperactivity disorder), combined type   Total Time spent with patient: 30 minutes  Musculoskeletal: Strength & Muscle Tone: within normal limits Gait & Station: normal Patient leans: N/A  Psychiatric Specialty Exam: Physical Exam  Constitutional: He is oriented to person, place, and time. He appears well-developed and well-nourished.  HENT:  Head: Normocephalic.  Respiratory: Effort normal.  Musculoskeletal: Normal range of motion.  Neurological: He is alert and oriented to person, place, and time.  Psychiatric: He has a normal mood and affect. His speech is normal and behavior is normal. Thought content normal. Cognition and memory are normal. He expresses impulsivity.  Pt with IDD, lives with his mother who is his guardian    ROS  Blood pressure (!) 110/56, pulse (!) 53, temperature 98.4 F (36.9 C), temperature source Oral, resp. rate 16, height 6\' 3"  (1.905 m), weight 68 kg, SpO2 99 %.Body mass index is 18.75 kg/m.  General Appearance: Casual  Eye Contact:  Fair  Speech:  Clear and Coherent and Normal Rate  Volume:  Normal  Mood:  Euthymic  Affect:  Congruent  Thought Process:  Coherent and Descriptions of Associations: Intact  Orientation:  Full (Time, Place, and Person)  Thought Content:  Logical  Suicidal Thoughts:  No  Homicidal Thoughts:  No  Memory:  Immediate;   Good Recent;   Good Remote;   Fair  Judgement:  Poor  Insight:  Shallow  Psychomotor Activity:  Normal  Concentration:  Concentration: Fair  Recall:  [employer] of Knowledge:  Fair  Language:  Good  Akathisia:  Negative  Handed:  Right  AIMS  (if indicated):     Assets:  Agricultural consultant Housing Physical Health Social Support  ADL's:  Intact  Cognition:  WNL  Sleep:   Good     Mental Status Per Nursing Assessment::   On Admission:   Aggressive behavior  Demographic Factors:  Male, Adolescent or young adult and Unemployed  Loss Factors: Financial problems/change in socioeconomic status  Historical Factors: Family history of mental illness or substance abuse  Risk Reduction Factors:   Sense of responsibility to family  Continued Clinical Symptoms:  More than one psychiatric diagnosis Previous Psychiatric Diagnoses and Treatments  Cognitive Features That Contribute To Risk:  Closed-mindedness    Suicide Risk:  Minimal: No identifiable suicidal ideation.  Patients presenting with no risk factors but with morbid ruminations; may be classified as minimal risk based on the severity of the depressive symptoms    Plan Of Care/Follow-up recommendations:  Activity:  as tolerated Diet:  Heart healthy  Ethelene Hal, NP 02/09/2019, 1:11 PM
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
## 2019-02-09 NOTE — ED Notes (Signed)
Pt DC off unit to home per MD. Pt calm, cooperative, no s/s of distress. Pt belongings given to pt. Pt ambulatory off unit, escorted off unit by NT. Pt transported by mother/guardian.

## 2019-02-15 ENCOUNTER — Other Ambulatory Visit: Payer: Self-pay

## 2019-02-15 ENCOUNTER — Ambulatory Visit
Admission: RE | Admit: 2019-02-15 | Discharge: 2019-02-15 | Disposition: A | Discharge status: Home / Self Care | Payer: Medicare Other | Source: Ambulatory Visit | Attending: Neurology | Admitting: Neurology

## 2019-02-15 DIAGNOSIS — R569 Unspecified convulsions: Secondary | ICD-10-CM

## 2019-02-15 DIAGNOSIS — R251 Tremor, unspecified: Secondary | ICD-10-CM

## 2019-02-15 DIAGNOSIS — Z79899 Other long term (current) drug therapy: Secondary | ICD-10-CM

## 2019-02-15 MED ORDER — GADOBENATE DIMEGLUMINE 529 MG/ML IV SOLN
17.0000 mL | Freq: Once | INTRAVENOUS | Status: AC | PRN
  Administered 2019-02-15: 17 mL via INTRAVENOUS

## 2019-02-20 ENCOUNTER — Telehealth: Payer: Self-pay | Admitting: *Deleted

## 2019-02-20 NOTE — Telephone Encounter (Signed)
I have spoken with his mother (on [place]).  She verbalized understanding of the patient's MRI results.

## 2019-02-20 NOTE — Telephone Encounter (Signed)
-----   Message from Marcial Pacas, MD sent at 02/20/2019  1:49 PM EDT ----- Please call pt for normal MRI brain.

## 2019-02-22 ENCOUNTER — Other Ambulatory Visit: Payer: Self-pay

## 2019-02-22 ENCOUNTER — Emergency Department (HOSPITAL_COMMUNITY)
Admission: EM | Admit: 2019-02-22 | Discharge: 2019-02-23 | Disposition: A | Discharge status: Home / Self Care | Payer: Medicare Other | Attending: Emergency Medicine | Admitting: Emergency Medicine

## 2019-02-22 ENCOUNTER — Encounter (HOSPITAL_COMMUNITY): Payer: Self-pay | Admitting: Emergency Medicine

## 2019-02-22 DIAGNOSIS — F17210 Nicotine dependence, cigarettes, uncomplicated: Secondary | ICD-10-CM | POA: Insufficient documentation

## 2019-02-22 DIAGNOSIS — F909 Attention-deficit hyperactivity disorder, unspecified type: Secondary | ICD-10-CM | POA: Diagnosis not present

## 2019-02-22 DIAGNOSIS — F840 Autistic disorder: Secondary | ICD-10-CM | POA: Insufficient documentation

## 2019-02-22 DIAGNOSIS — Z79899 Other long term (current) drug therapy: Secondary | ICD-10-CM | POA: Insufficient documentation

## 2019-02-22 DIAGNOSIS — F29 Unspecified psychosis not due to a substance or known physiological condition: Secondary | ICD-10-CM | POA: Diagnosis present

## 2019-02-22 DIAGNOSIS — F4325 Adjustment disorder with mixed disturbance of emotions and conduct: Secondary | ICD-10-CM | POA: Diagnosis present

## 2019-02-22 DIAGNOSIS — F919 Conduct disorder, unspecified: Secondary | ICD-10-CM | POA: Diagnosis not present

## 2019-02-22 DIAGNOSIS — R4689 Other symptoms and signs involving appearance and behavior: Secondary | ICD-10-CM

## 2019-02-22 DIAGNOSIS — Z9114 Patient's other noncompliance with medication regimen: Secondary | ICD-10-CM | POA: Diagnosis not present

## 2019-02-22 LAB — RAPID URINE DRUG SCREEN, HOSP PERFORMED
Amphetamines: NOT DETECTED
Barbiturates: NOT DETECTED
Benzodiazepines: NOT DETECTED
Cocaine: NOT DETECTED
Opiates: NOT DETECTED
Tetrahydrocannabinol: NOT DETECTED

## 2019-02-22 LAB — COMPREHENSIVE METABOLIC PANEL
ALT: 22 U/L (ref 0–44)
AST: 18 U/L (ref 15–41)
Albumin: 3.8 g/dL (ref 3.5–5.0)
Alkaline Phosphatase: 36 U/L — ABNORMAL LOW (ref 38–126)
Anion gap: 7 (ref 5–15)
BUN: 14 mg/dL (ref 6–20)
CO2: 26 mmol/L (ref 22–32)
Calcium: 9.1 mg/dL (ref 8.9–10.3)
Chloride: 108 mmol/L (ref 98–111)
Creatinine, Ser: 0.92 mg/dL (ref 0.61–1.24)
GFR calc Af Amer: >60 mL/min (ref >60)
GFR calc non Af Amer: >60 mL/min (ref >60)
Glucose, Bld: 88 mg/dL (ref 70–99)
Potassium: 3.8 mmol/L (ref 3.5–5.1)
Sodium: 141 mmol/L (ref 135–145)
Total Bilirubin: 0.4 mg/dL (ref 0.3–1.2)
Total Protein: 6.9 g/dL (ref 6.5–8.1)

## 2019-02-22 LAB — CBC
HCT: 38.6 % — ABNORMAL LOW (ref 39.0–52.0)
Hemoglobin: 13.2 g/dL (ref 13.0–17.0)
MCH: 32.8 pg (ref 26.0–34.0)
MCHC: 34.2 g/dL (ref 30.0–36.0)
MCV: 95.8 fL (ref 80.0–100.0)
Platelets: 123 10*3/uL — ABNORMAL LOW (ref 150–400)
RBC: 4.03 MIL/uL — ABNORMAL LOW (ref 4.22–5.81)
RDW: 11.8 % (ref 11.5–15.5)
WBC: 5.5 10*3/uL (ref 4.0–10.5)
nRBC: 0 % (ref 0.0–0.2)

## 2019-02-22 LAB — ETHANOL: Alcohol, Ethyl (B): <10 mg/dL (ref <10)

## 2019-02-22 MED ORDER — CARBAMAZEPINE ER 100 MG PO TB12
300.0000 mg | ORAL_TABLET | Freq: Two times a day (BID) | ORAL | Status: DC
  Administered 2019-02-22 – 2019-02-23 (×2): 300 mg via ORAL
  Filled 2019-02-22 (×4): qty 3

## 2019-02-22 MED ORDER — AMPHETAMINE-DEXTROAMPHETAMINE 10 MG PO TABS
20.0000 mg | ORAL_TABLET | Freq: Two times a day (BID) | ORAL | Status: DC
  Administered 2019-02-22 – 2019-02-23 (×2): 20 mg via ORAL
  Filled 2019-02-22 (×2): qty 2

## 2019-02-22 MED ORDER — ALUM & MAG HYDROXIDE-SIMETH 200-200-20 MG/5ML PO SUSP: 30.0000 mL | Freq: Four times a day (QID) | ORAL | Status: DC | PRN

## 2019-02-22 MED ORDER — ACETAMINOPHEN 325 MG PO TABS: 650.0000 mg | ORAL_TABLET | ORAL | Status: DC | PRN

## 2019-02-22 MED ORDER — RISPERIDONE 2 MG PO TABS
4.0000 mg | ORAL_TABLET | Freq: Every day | ORAL | Status: DC
  Administered 2019-02-22 – 2019-02-23 (×2): 4 mg via ORAL
  Filled 2019-02-22 (×2): qty 2

## 2019-02-22 MED ORDER — DIVALPROEX SODIUM ER 500 MG PO TB24
500.0000 mg | ORAL_TABLET | Freq: Every day | ORAL | Status: DC
  Administered 2019-02-22: 21:00:00 500 mg via ORAL
  Filled 2019-02-22: qty 1

## 2019-02-22 MED ORDER — HYDROXYZINE HCL 25 MG PO TABS
50.0000 mg | ORAL_TABLET | Freq: Every day | ORAL | Status: DC
  Administered 2019-02-22: 21:00:00 50 mg via ORAL
  Filled 2019-02-22: qty 2

## 2019-02-22 MED ORDER — NICOTINE 21 MG/24HR TD PT24: 21.0000 mg | MEDICATED_PATCH | Freq: Every day | TRANSDERMAL | Status: DC | PRN

## 2019-02-22 MED ORDER — VITAMIN D3 25 MCG (1000 UNIT) PO TABS
1000.0000 [IU] | ORAL_TABLET | Freq: Every day | ORAL | Status: DC
  Administered 2019-02-23: 1000 [IU] via ORAL
  Filled 2019-02-22: qty 1

## 2019-02-22 MED ORDER — CLONIDINE HCL 0.1 MG PO TABS
0.1000 mg | ORAL_TABLET | Freq: Two times a day (BID) | ORAL | Status: DC
  Administered 2019-02-22 – 2019-02-23 (×2): 0.1 mg via ORAL
  Filled 2019-02-22 (×2): qty 1
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
## 2019-02-22 NOTE — ED Notes (Signed)
Pt's mother wants to be contacted on all decision making.  She feels that he needs placement.

## 2019-02-22 NOTE — ED Notes (Signed)
[name](mother) needs to speak to someone asap
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
## 2019-02-23 DIAGNOSIS — F919 Conduct disorder, unspecified: Secondary | ICD-10-CM | POA: Diagnosis not present

## 2019-02-23 DIAGNOSIS — F4325 Adjustment disorder with mixed disturbance of emotions and conduct: Secondary | ICD-10-CM

## 2019-02-23 NOTE — Discharge Instructions (Signed)
For your mental health needs, you are advised to follow up with [facility].  Call them at your earliest opportunity to schedule an intake appointment:       [facility]      [place], [place] [address]      [phone]      Crisis number: [phone]  You are also advised to stay in contact with your Care Coordinator at the [facility] to consider other treatment options.  Please note that the phone number listed below also serves as a 24 hour crisis number:       [facility]      [phone]

## 2019-02-23 NOTE — BH Assessment (Signed)
[facility] Assessment Progress Note  Per Buford Dresser, DO, this pt does not require psychiatric hospitalization at this time.  Pt presents under IVC initiated by pt's mother, who is also pt's legal guardian.  Dr Mariea Clonts has rescinded IVC.  Pt is to be discharged from [facility] with recommendation to follow up with [facility].  This has been included in pt's discharge instructions.  At 13:12 this writer called pt's mother, Marga Melnick [phone]) to discuss disposition.  Ms Jimmye Norman is very upset, and reports that she will call back later with a plan to pick pt up.  She reports that pt has a care coordinator at the [facility].  I have verbally advised her to continue pt's treatment at [facility], and to continue communications with the care coordinator to explore other treatment options.  This has also been included in pt's discharge instructions.  Pt's nurse, Nena Jordan, has been notified.  Jalene Mullet, [place] Triage Specialist [phone]

## 2019-02-23 NOTE — ED Notes (Signed)
Pt discharged safely with parents.  Pt was in no distress.

## 2019-02-23 NOTE — Consult Note (Addendum)
[facility] Psych ED Discharge  02/23/2019 2:37 PM Geoffrey [name]  MRN:  [id number] Principal Problem: Adjustment disorder with mixed disturbance of emotions and conduct Discharge Diagnoses: Principal Problem:   Adjustment disorder with mixed disturbance of emotions and conduct   Subjective: Pt was seen and chart reviewed with treatment team and Dr Mariea Clonts. Pt denies suicidal/homicidal ideation, denies auditory/visual hallucinations and does not appear to be responding to internal stimuli. Pt presented to the [facility] under IVC paperwork taken out by his mother. Paperwork stated he has not been taking his medications. Pt reports that his mother will not let him do anything on his own and she watches everything he does. Pt was seen at the ED two weeks ago for a similar behavioral issue. He denies a history of suicide attempts or self-harming behaviors. His mother also listed concerns that Pt is using drugs and alcohol. Today, UDS and BAL are negative and Pt denies drug and alcohol use.   Pt resides with his mother, step-father, 1 sister and 3 brothers. He does not work and is not in school. Pt is calm and cooperative and has had no behavioral outbursts in the ED. Pt has been medication compliant while in the emergency room. Pt is psychiatrically clear.   Total Time spent with patient: 30 minutes  Past Psychiatric History: As listed below.   Past Medical History:  Past Medical History:  Diagnosis Date  . ADHD (attention deficit hyperactivity disorder)   . Aggressive behavior   . Autism   . Conduct disorder   . Delay in development   . Depression   . Epilepsy ([place])   . Essential tremor   . 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 Nicotine dependence   . Seizures ([place])     Past Surgical History:  Procedure Laterality Date  . NO PAST SURGERIES     Family History:  Family History  Problem Relation Age of Onset  . Epilepsy Father   . Cerebral palsy Brother   . Healthy Brother   . Asthma Other   . Cancer Other    . Diabetes Other   . Stroke Other   . COPD Other   . Heart attack Other    Family Psychiatric  History: Denies  Social History:  Social History   Substance and Sexual Activity  Alcohol Use Yes   Comment: socially     Social History   Substance and Sexual Activity  Drug Use Yes   Comment: Patient denies     Social History   Socioeconomic History  . Marital status: Single    Spouse name: Not on file  . Number of children: Not on file  . Years of education: Not on file  . Highest education level: Not on file  Occupational History  . Not on file  Social Needs  . Financial resource strain: Not on file  . Food insecurity    Worry: Not on file    Inability: Not on file  . Transportation needs    Medical: Not on file    Non-medical: Not on file  Tobacco Use  . Smoking status: Current Every Day Smoker    Years: 2.00    Types: Cigars  . Smokeless tobacco: Never Used  Substance and Sexual Activity  . Alcohol use: Yes    Comment: socially  . Drug use: Yes    Comment: Patient denies   . Sexual activity: Never  Lifestyle  . Physical activity    Days per week: Not on  file    Minutes per session: Not on file  . Stress: Not on file  Relationships  . Social Musicianconnections    Talks on phone: Not on file    Gets together: Not on file    Attends religious service: Not on file    Active member of club or organization: Not on file    Attends meetings of clubs or organizations: Not on file    Relationship status: Not on file  Other Topics Concern  . Not on file  Social History Narrative  . Not on file    Has this patient used any form of tobacco in the last 30 days? (Cigarettes, Smokeless Tobacco, Cigars, and/or Pipes) Prescription not provided because: Pt denies tobacco use.   Current Medications: Current Facility-Administered Medications  Medication Dose Route Frequency Provider Last Rate Last Dose  . acetaminophen (TYLENOL) tablet 650 mg  650 mg Oral Q4H PRN  Petrucelli, Samantha R, PA-C      . alum & mag hydroxide-simeth (MAALOX/MYLANTA) 200-200-20 MG/5ML suspension 30 mL  30 mL Oral Q6H PRN Petrucelli, Samantha R, PA-C      . carbamazepine (TEGRETOL XR) 12 hr tablet 300 mg  300 mg Oral BID Petrucelli, Samantha R, PA-C   300 mg at 02/23/19 1028  . cholecalciferol (VITAMIN D) tablet 1,000 Units  1,000 Units Oral Daily Petrucelli, Samantha R, PA-C   1,000 Units at 02/23/19 1028  . cloNIDine (CATAPRES) tablet 0.1 mg  0.1 mg Oral BID Petrucelli, Samantha R, PA-C   0.1 mg at 02/23/19 1028  . divalproex (DEPAKOTE ER) 24 hr tablet 500 mg  500 mg Oral QHS Petrucelli, Samantha R, PA-C   500 mg at 02/22/19 2114  . hydrOXYzine (ATARAX/VISTARIL) tablet 50-100 mg  50-100 mg Oral QHS Petrucelli, Samantha R, PA-C   50 mg at 02/22/19 2114  . nicotine (NICODERM CQ - dosed in mg/24 hours) patch 21 mg  21 mg Transdermal Daily PRN Petrucelli, Samantha R, PA-C      . risperiDONE (RISPERDAL) tablet 4 mg  4 mg Oral Daily Petrucelli, Samantha R, PA-C   4 mg at 02/23/19 1043   Current Outpatient Medications  Medication Sig Dispense Refill  . amphetamine-dextroamphetamine (ADDERALL) 20 MG tablet Take 20 mg by mouth 2 (two) times daily.     . carbamazepine (CARBATROL) 300 MG 12 hr capsule Take 1 capsule (300 mg total) by mouth 2 (two) times daily. 60 capsule 11  . cholecalciferol (VITAMIN D) 25 MCG (1000 UT) tablet Take 1,000 Units by mouth daily.    . cloNIDine (CATAPRES) 0.1 MG tablet Take 1 tablet (0.1 mg total) by mouth 2 (two) times daily. 60 tablet 0  . divalproex (DEPAKOTE ER) 500 MG 24 hr tablet Take 1 tablet (500 mg total) by mouth at bedtime. 30 tablet 11  . hydrOXYzine (ATARAX/VISTARIL) 50 MG tablet Take 50-100 mg by mouth at bedtime.    . risperiDONE (RISPERDAL) 2 MG tablet Take 4 mg by mouth daily. At 4 PM      Musculoskeletal: Strength & Muscle Tone: within normal limits Gait & Station: normal Patient leans: N/A  Psychiatric Specialty Exam: Physical Exam   Nursing note and vitals reviewed. Constitutional: He appears well-developed and well-nourished.  HENT:  Head: Normocephalic.  Neck: Normal range of motion.  Respiratory: Effort normal.  Musculoskeletal: Normal range of motion.  Neurological: He is alert.    Review of Systems  Psychiatric/Behavioral:       Pt denies any current psychiatric symptoms  All other systems reviewed and are negative.   Blood pressure 117/71, pulse 73, temperature 98.7 F (37.1 C), temperature source Oral, resp. rate 16, height 6\' 3"  (1.905 m), weight 68 kg, SpO2 98 %.Body mass index is 18.75 kg/m.  General Appearance: Casual  Eye Contact:  Fair  Speech:  Clear and Coherent and Normal Rate  Volume:  Decreased  Mood:  Euthymic  Affect:  Congruent  Thought Process:  Coherent and Descriptions of Associations: Intact  Orientation:  Full (Time, Place, and Person)  Thought Content:  Logical  Suicidal Thoughts:  No  Homicidal Thoughts:  No  Memory:  Immediate;   Good Recent;   Good Remote;   Fair  Judgement:  Fair  Insight:  Fair  Psychomotor Activity:  Normal  Concentration:  Concentration: Fair and Attention Span: Fair  Recall:  [employer] of Knowledge:  Good  Language:  Good  Akathisia:  Negative  Handed:  Right  AIMS (if indicated):   N/A  Assets:  Architect Housing Social Support  ADL's:  Intact  Cognition:  WNL  Sleep:   N/A     Demographic Factors:  Male, Adolescent or young adult and Unemployed  Loss Factors: NA  Historical Factors: Impulsivity  Risk Reduction Factors:   Sense of responsibility to family and Living with another person, especially a relative  Continued Clinical Symptoms:  Previous Psychiatric Diagnoses and Treatments  Cognitive Features That Contribute To Risk:  Closed-mindedness    Suicide Risk:  Minimal: No identifiable suicidal ideation.  Patients presenting with no risk factors but with morbid ruminations;  may be classified as minimal risk based on the severity of the depressive symptoms   Plan Of Care/Follow-up recommendations:  Activity:  as tolerated Diet:  Heart Healthy  Disposition and Treatment Plan: Adjustment disorder with mixed disturbance of emotions and conduct  Take all medications as prescribed by your outpatient provider. Keep all follow-up appointments as scheduled.  Do not consume alcohol or use illegal drugs while on prescription medications. Report any adverse effects from your medications to your primary care provider promptly.  In the event of recurrent symptoms or worsening symptoms, call 911, a crisis hotline, or go to the nearest emergency department for evaluation.   Laveda Abbe, NP 02/23/2019, 2:37 PM   Patient seen by telemedicine for psychiatric evaluation, chart reviewed and case discussed with the physician extender and developed treatment plan. Reviewed the information documented and agree with the treatment plan.  Juanetta Beets, DO 02/23/19 4:14 PM

## 2019-02-24 NOTE — BH Assessment (Signed)
[facility] Assessment Progress Note  At 09:27 on 02/24/2019 this writer called the [facility] to ascertain whether pt has a Glass blower/designer.  I was routed to [place] [phone]) whom they identify as pt's care coordinator.  I notified him of pt's disposition as it developed yesterday.  He provided his contact information as noted above, and agrees to send pt's psychometric testing to me to enter into pt's record.  As of this writing, this is pending.  [place]'s information has been entered into pt's FYI's.  Jalene Mullet, [place] Coordinator [phone]

## 2019-03-27 ENCOUNTER — Emergency Department (HOSPITAL_COMMUNITY)
Admission: EM | Admit: 2019-03-27 | Discharge: 2019-03-28 | Disposition: A | Discharge status: Home / Self Care | Payer: Medicare Other | Attending: Emergency Medicine | Admitting: Emergency Medicine

## 2019-03-27 ENCOUNTER — Encounter (HOSPITAL_COMMUNITY): Payer: Self-pay

## 2019-03-27 DIAGNOSIS — F1910 Other psychoactive substance abuse, uncomplicated: Secondary | ICD-10-CM | POA: Diagnosis not present

## 2019-03-27 DIAGNOSIS — T07XXXA Unspecified multiple injuries, initial encounter: Secondary | ICD-10-CM

## 2019-03-27 DIAGNOSIS — W504XXA Accidental scratch by another person, initial encounter: Secondary | ICD-10-CM | POA: Diagnosis not present

## 2019-03-27 DIAGNOSIS — Z79899 Other long term (current) drug therapy: Secondary | ICD-10-CM | POA: Insufficient documentation

## 2019-03-27 DIAGNOSIS — F919 Conduct disorder, unspecified: Secondary | ICD-10-CM | POA: Diagnosis not present

## 2019-03-27 DIAGNOSIS — Z046 Encounter for general psychiatric examination, requested by authority: Secondary | ICD-10-CM | POA: Diagnosis not present

## 2019-03-27 DIAGNOSIS — S1091XA Abrasion of unspecified part of neck, initial encounter: Secondary | ICD-10-CM | POA: Insufficient documentation

## 2019-03-27 DIAGNOSIS — F902 Attention-deficit hyperactivity disorder, combined type: Secondary | ICD-10-CM | POA: Diagnosis not present

## 2019-03-27 DIAGNOSIS — Y9389 Activity, other specified: Secondary | ICD-10-CM | POA: Insufficient documentation

## 2019-03-27 DIAGNOSIS — Y998 Other external cause status: Secondary | ICD-10-CM | POA: Insufficient documentation

## 2019-03-27 DIAGNOSIS — F913 Oppositional defiant disorder: Secondary | ICD-10-CM | POA: Diagnosis present

## 2019-03-27 DIAGNOSIS — Y929 Unspecified place or not applicable: Secondary | ICD-10-CM | POA: Insufficient documentation

## 2019-03-27 DIAGNOSIS — F17290 Nicotine dependence, other tobacco product, uncomplicated: Secondary | ICD-10-CM | POA: Insufficient documentation

## 2019-03-27 DIAGNOSIS — R251 Tremor, unspecified: Secondary | ICD-10-CM | POA: Insufficient documentation

## 2019-03-27 DIAGNOSIS — F332 Major depressive disorder, recurrent severe without psychotic features: Secondary | ICD-10-CM | POA: Insufficient documentation

## 2019-03-27 DIAGNOSIS — F840 Autistic disorder: Secondary | ICD-10-CM | POA: Diagnosis present

## 2019-03-27 DIAGNOSIS — F1510 Other stimulant abuse, uncomplicated: Secondary | ICD-10-CM | POA: Insufficient documentation

## 2019-03-27 DIAGNOSIS — R4689 Other symptoms and signs involving appearance and behavior: Secondary | ICD-10-CM | POA: Diagnosis present

## 2019-03-27 DIAGNOSIS — F4325 Adjustment disorder with mixed disturbance of emotions and conduct: Secondary | ICD-10-CM | POA: Diagnosis present

## 2019-03-27 LAB — COMPREHENSIVE METABOLIC PANEL
ALT: 72 U/L — ABNORMAL HIGH (ref 0–44)
AST: 54 U/L — ABNORMAL HIGH (ref 15–41)
Albumin: 3.8 g/dL (ref 3.5–5.0)
Alkaline Phosphatase: 35 U/L — ABNORMAL LOW (ref 38–126)
Anion gap: 4 — ABNORMAL LOW (ref 5–15)
BUN: 14 mg/dL (ref 6–20)
CO2: 27 mmol/L (ref 22–32)
Calcium: 8.3 mg/dL — ABNORMAL LOW (ref 8.9–10.3)
Chloride: 103 mmol/L (ref 98–111)
Creatinine, Ser: 1.13 mg/dL (ref 0.61–1.24)
GFR calc Af Amer: >60 mL/min (ref >60)
GFR calc non Af Amer: >60 mL/min (ref >60)
Glucose, Bld: 88 mg/dL (ref 70–99)
Potassium: 3.8 mmol/L (ref 3.5–5.1)
Sodium: 134 mmol/L — ABNORMAL LOW (ref 135–145)
Total Bilirubin: 0.8 mg/dL (ref 0.3–1.2)
Total Protein: 6.3 g/dL — ABNORMAL LOW (ref 6.5–8.1)

## 2019-03-27 LAB — CBC
HCT: 41.4 % (ref 39.0–52.0)
Hemoglobin: 13.8 g/dL (ref 13.0–17.0)
MCH: 32 pg (ref 26.0–34.0)
MCHC: 33.3 g/dL (ref 30.0–36.0)
MCV: 96.1 fL (ref 80.0–100.0)
Platelets: 141 10*3/uL — ABNORMAL LOW (ref 150–400)
RBC: 4.31 MIL/uL (ref 4.22–5.81)
RDW: 12.4 % (ref 11.5–15.5)
WBC: 6.8 10*3/uL (ref 4.0–10.5)
nRBC: 0 % (ref 0.0–0.2)

## 2019-03-27 LAB — CARBAMAZEPINE LEVEL, TOTAL: Carbamazepine Lvl: 5.1 ug/mL (ref 4.0–12.0)

## 2019-03-27 LAB — ETHANOL: Alcohol, Ethyl (B): <10 mg/dL (ref <10)

## 2019-03-27 LAB — VALPROIC ACID LEVEL: Valproic Acid Lvl: 28 ug/mL — ABNORMAL LOW (ref 50.0–100.0)

## 2019-03-27 MED ORDER — VITAMIN D3 25 MCG (1000 UNIT) PO TABS
1000.0000 [IU] | ORAL_TABLET | Freq: Every day | ORAL | Status: DC
  Administered 2019-03-27 – 2019-03-28 (×2): 1000 [IU] via ORAL
  Filled 2019-03-27 (×3): qty 1

## 2019-03-27 MED ORDER — CARBAMAZEPINE ER 300 MG PO CP12: 300.0000 mg | ORAL_CAPSULE | Freq: Two times a day (BID) | ORAL | Status: DC

## 2019-03-27 MED ORDER — CARBAMAZEPINE ER 200 MG PO TB12
300.0000 mg | ORAL_TABLET | Freq: Two times a day (BID) | ORAL | Status: DC
  Administered 2019-03-27 – 2019-03-28 (×3): 300 mg via ORAL
  Filled 2019-03-27 (×5): qty 1

## 2019-03-27 MED ORDER — ONDANSETRON HCL 4 MG PO TABS: 4.0000 mg | ORAL_TABLET | Freq: Three times a day (TID) | ORAL | Status: DC | PRN

## 2019-03-27 MED ORDER — DIVALPROEX SODIUM ER 500 MG PO TB24
500.0000 mg | ORAL_TABLET | Freq: Every day | ORAL | Status: DC
  Administered 2019-03-27: 500 mg via ORAL
  Filled 2019-03-27: qty 1

## 2019-03-27 MED ORDER — ACETAMINOPHEN 325 MG PO TABS: 650.0000 mg | ORAL_TABLET | ORAL | Status: DC | PRN

## 2019-03-27 MED ORDER — AMPHETAMINE-DEXTROAMPHETAMINE 10 MG PO TABS
20.0000 mg | ORAL_TABLET | Freq: Two times a day (BID) | ORAL | Status: DC
  Administered 2019-03-27 – 2019-03-28 (×2): 20 mg via ORAL
  Filled 2019-03-27 (×3): qty 2

## 2019-03-27 MED ORDER — ALUM & MAG HYDROXIDE-SIMETH 200-200-20 MG/5ML PO SUSP: 30.0000 mL | Freq: Four times a day (QID) | ORAL | Status: DC | PRN

## 2019-03-27 MED ORDER — RISPERIDONE 2 MG PO TABS
4.0000 mg | ORAL_TABLET | Freq: Every day | ORAL | Status: DC
  Administered 2019-03-27 – 2019-03-28 (×2): 4 mg via ORAL
  Filled 2019-03-27 (×2): qty 2

## 2019-03-27 NOTE — ED Notes (Signed)
Pt dressed out in scrubs- maroon top, and blue bottom, as no maroon bottoms were available in an appropriate side. Pt has 2 bags of belongings at triage nurses' station, labelled with patient labels. Pt wanded by security.

## 2019-03-27 NOTE — ED Notes (Signed)
Requested urine from pt

## 2019-03-27 NOTE — ED Notes (Signed)
Pt has been calm and in bed resting. Took po medications ordered.

## 2019-03-27 NOTE — ED Notes (Signed)
Had difficulty with blood draw and was only able to get one green top. Phlebotomy called to attempt again.

## 2019-03-27 NOTE — BH Assessment (Signed)
[facility] Assessment Progress Note  Case was staffed with [place] NP who recommended patient be observed and monitored.      
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
## 2019-03-27 NOTE — Progress Notes (Signed)
Received Geoffrey [name] this PM asleep in his bed, he continued to sleep throughout the evening, Later he was medicated per order. He slept well  throughout the night.
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
## 2019-03-28 ENCOUNTER — Encounter (HOSPITAL_COMMUNITY): Payer: Self-pay | Admitting: Registered Nurse

## 2019-03-28 DIAGNOSIS — S1091XA Abrasion of unspecified part of neck, initial encounter: Secondary | ICD-10-CM | POA: Diagnosis not present

## 2019-03-28 LAB — RAPID URINE DRUG SCREEN, HOSP PERFORMED
Amphetamines: POSITIVE — AB
Barbiturates: NOT DETECTED
Benzodiazepines: NOT DETECTED
Cocaine: NOT DETECTED
Opiates: NOT DETECTED
Tetrahydrocannabinol: NOT DETECTED

## 2019-03-28 NOTE — ED Notes (Signed)
Mother informed counselor that she is in [place] and will pick him up this afternoon.
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
## 2019-03-28 NOTE — ED Notes (Signed)
Pt discharged safely with mother who is also his guardian.  Pt was calm and cooperative.  All belongings were sent with patient.
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
## 2019-04-04 ENCOUNTER — Telehealth: Payer: Self-pay | Admitting: Neurology

## 2019-04-04 NOTE — Telephone Encounter (Signed)
LVM to discuss r/s 11/3 appt due to office being closed

## 2019-04-08 ENCOUNTER — Other Ambulatory Visit: Payer: Self-pay

## 2019-04-08 ENCOUNTER — Encounter (HOSPITAL_COMMUNITY): Payer: Self-pay | Admitting: Emergency Medicine

## 2019-04-08 ENCOUNTER — Emergency Department (HOSPITAL_COMMUNITY)
Admission: EM | Admit: 2019-04-08 | Discharge: 2019-04-09 | Disposition: A | Discharge status: Home / Self Care | Payer: Medicare Other | Attending: Emergency Medicine | Admitting: Emergency Medicine

## 2019-04-08 DIAGNOSIS — F79 Unspecified intellectual disabilities: Secondary | ICD-10-CM | POA: Diagnosis not present

## 2019-04-08 DIAGNOSIS — Z79899 Other long term (current) drug therapy: Secondary | ICD-10-CM | POA: Insufficient documentation

## 2019-04-08 DIAGNOSIS — F17290 Nicotine dependence, other tobacco product, uncomplicated: Secondary | ICD-10-CM | POA: Diagnosis not present

## 2019-04-08 DIAGNOSIS — R4182 Altered mental status, unspecified: Secondary | ICD-10-CM | POA: Diagnosis present

## 2019-04-08 DIAGNOSIS — Y906 Blood alcohol level of 120-199 mg/100 ml: Secondary | ICD-10-CM | POA: Insufficient documentation

## 2019-04-08 DIAGNOSIS — F840 Autistic disorder: Secondary | ICD-10-CM | POA: Insufficient documentation

## 2019-04-08 DIAGNOSIS — F10920 Alcohol use, unspecified with intoxication, uncomplicated: Secondary | ICD-10-CM | POA: Insufficient documentation

## 2019-04-08 LAB — COMPREHENSIVE METABOLIC PANEL
ALT: 42 U/L (ref 0–44)
AST: 27 U/L (ref 15–41)
Albumin: 4.4 g/dL (ref 3.5–5.0)
Alkaline Phosphatase: 40 U/L (ref 38–126)
Anion gap: 10 (ref 5–15)
BUN: 11 mg/dL (ref 6–20)
CO2: 23 mmol/L (ref 22–32)
Calcium: 8.6 mg/dL — ABNORMAL LOW (ref 8.9–10.3)
Chloride: 105 mmol/L (ref 98–111)
Creatinine, Ser: 0.99 mg/dL (ref 0.61–1.24)
GFR calc Af Amer: >60 mL/min (ref >60)
GFR calc non Af Amer: >60 mL/min (ref >60)
Glucose, Bld: 130 mg/dL — ABNORMAL HIGH (ref 70–99)
Potassium: 3.1 mmol/L — ABNORMAL LOW (ref 3.5–5.1)
Sodium: 138 mmol/L (ref 135–145)
Total Bilirubin: 0.4 mg/dL (ref 0.3–1.2)
Total Protein: 7.3 g/dL (ref 6.5–8.1)

## 2019-04-08 LAB — CBC WITH DIFFERENTIAL/PLATELET
Abs Immature Granulocytes: 0.02 10*3/uL (ref 0.00–0.07)
Basophils Absolute: 0 10*3/uL (ref 0.0–0.1)
Basophils Relative: 0 %
Eosinophils Absolute: 0 10*3/uL (ref 0.0–0.5)
Eosinophils Relative: 0 %
HCT: 42.4 % (ref 39.0–52.0)
Hemoglobin: 14.2 g/dL (ref 13.0–17.0)
Immature Granulocytes: 0 %
Lymphocytes Relative: 23 %
Lymphs Abs: 1.6 10*3/uL (ref 0.7–4.0)
MCH: 32 pg (ref 26.0–34.0)
MCHC: 33.5 g/dL (ref 30.0–36.0)
MCV: 95.5 fL (ref 80.0–100.0)
Monocytes Absolute: 0.3 10*3/uL (ref 0.1–1.0)
Monocytes Relative: 5 %
Neutro Abs: 4.9 10*3/uL (ref 1.7–7.7)
Neutrophils Relative %: 72 %
Platelets: 197 10*3/uL (ref 150–400)
RBC: 4.44 MIL/uL (ref 4.22–5.81)
RDW: 11.9 % (ref 11.5–15.5)
WBC: 6.8 10*3/uL (ref 4.0–10.5)
nRBC: 0 % (ref 0.0–0.2)

## 2019-04-08 LAB — ETHANOL: Alcohol, Ethyl (B): 197 mg/dL — ABNORMAL HIGH (ref <10)

## 2019-04-08 LAB — LIPASE, BLOOD: Lipase: 19 U/L (ref 11–51)
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
## 2019-04-09 DIAGNOSIS — F10920 Alcohol use, unspecified with intoxication, uncomplicated: Secondary | ICD-10-CM | POA: Diagnosis not present

## 2019-04-09 MED ORDER — ONDANSETRON 4 MG PO TBDP
4.0000 mg | ORAL_TABLET | Freq: Once | ORAL | Status: AC
  Administered 2019-04-09: 4 mg via ORAL
  Filled 2019-04-09: qty 1

## 2019-04-09 NOTE — ED Notes (Signed)
Pt able to ambulate independently to the bathroom.

## 2019-04-09 NOTE — ED Provider Notes (Signed)
[place] DEPT Provider Note  CSN: [id number] Arrival date & time: 04/08/19 2035  Chief Complaint(s) Alcohol Intoxication and Abdominal Pain  HPI Geoffrey [name] is a 23 y.o. male who presents to the emergency department for alcohol intoxication brought in by [facility] after patient had one episode of emesis.  Patient denies any physical complaints at this time.  Admitted to drinking alcohol.  Denied any illicit drug use.  Has no headache, neck pain, back pain, chest pain, shortness of breath, abdominal pain.    HPI  Past Medical History Past Medical History:  Diagnosis Date  . ADHD (attention deficit hyperactivity disorder)   . Aggressive behavior   . Autism   . Conduct disorder   . Delay in development   . Depression   . Epilepsy ([place])   . Essential tremor   . Lung granuloma ([place])   . Nicotine dependence   . Seizures [facility])    Patient Active Problem List   Diagnosis Date Noted  . Tremor 01/23/2019  . Seizures ([place]) 01/23/2019  . Polypharmacy 01/23/2019  . Adjustment disorder with mixed disturbance of emotions and conduct 10/21/2015  . Involuntary commitment   . Aggression 10/02/2014  . Autism 10/02/2014  . Aggressive behavior   . Oppositional defiant disorder 05/20/2013  . Post traumatic stress disorder (PTSD) 05/20/2013  . Excessive anger 05/20/2013  . ADD (attention deficit disorder) 05/20/2013  . Vitamin D insufficiency 12/30/2012  . Loss of weight 12/30/2012  . Abnormal weight loss 12/21/2012  . Intellectual disability 11/03/2012  . ADHD (attention deficit hyperactivity disorder), 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  . 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 Language disorder involving understanding and expression of language 11/03/2012  . Environmental allergies 11/03/2012  . Delay in development   . Other convulsions 10/05/2012  . Essential and other specified forms of tremor 10/05/2012  . Unspecified mental or behavioral problem 10/05/2012  .  Undersocialized conduct disorder, aggressive type, unspecified 10/05/2012   Home Medication(s) Prior to Admission medications   Medication Sig Start Date End Date Taking? Authorizing Provider  amphetamine-dextroamphetamine (ADDERALL) 20 MG tablet Take 20 mg by mouth 2 (two) times daily.     [provider]  carbamazepine (CARBATROL) 300 MG 12 hr capsule Take 1 capsule (300 mg total) by mouth 2 (two) times daily. 01/23/19   Marcial Pacas, MD  cholecalciferol (VITAMIN D) 25 MCG (1000 UT) tablet Take 1,000 Units by mouth daily.    [provider]  cloNIDine (CATAPRES) 0.1 MG tablet Take 1 tablet (0.1 mg total) by mouth 2 (two) times daily. 10/05/14   Delfin Gant, NP  divalproex (DEPAKOTE ER) 500 MG 24 hr tablet Take 1 tablet (500 mg total) by mouth at bedtime. 01/23/19   Marcial Pacas, MD  hydrOXYzine (ATARAX/VISTARIL) 50 MG tablet Take 50-100 mg by mouth at bedtime. 02/16/19   [provider]  risperiDONE (RISPERDAL) 2 MG tablet Take 4 mg by mouth daily. At 4 PM    [provider]  Past Surgical History Past Surgical History:  Procedure Laterality Date  . NO PAST SURGERIES     Family History Family History  Problem Relation Age of Onset  . Epilepsy Father   . Cerebral palsy Brother   . Healthy Brother   . Asthma Other   . Cancer Other   . Diabetes Other   . Stroke Other   . COPD Other   . Heart attack Other     Social History Social History   Tobacco Use  . Smoking status: Current Every Day Smoker    Years: 2.00    Types: Cigars  . Smokeless tobacco: Never Used  Substance Use Topics  . Alcohol use: Yes    Comment: socially  . Drug use: Yes    Comment: Patient denies    Allergies Patient has no known allergies.  Review of Systems Review of Systems All other systems are reviewed and are negative for acute  change except as noted in the HPI  Physical Exam Vital Signs  I have reviewed the triage vital signs BP 117/72   Pulse 70   Temp (!) 96.5 F (35.8 C) (Axillary) Comment: pt not awake enough to get oral temperature. He was biting the thermometer.  Resp 15   SpO2 100%   Physical Exam Vitals signs reviewed.  Constitutional:      General: He is not in acute distress.    Appearance: He is well-developed. He is not diaphoretic.     Comments: intoxicated  HENT:     Head: Normocephalic and atraumatic.     Jaw: No trismus.     Right Ear: External ear normal.     Left Ear: External ear normal.     Nose: Nose normal.  Eyes:     General: No scleral icterus.    Conjunctiva/sclera: Conjunctivae normal.  Neck:     Musculoskeletal: Normal range of motion.     Trachea: Phonation normal.  Cardiovascular:     Rate and Rhythm: Normal rate and regular rhythm.  Pulmonary:     Effort: Pulmonary effort is normal. No respiratory distress.     Breath sounds: No stridor.  Abdominal:     General: There is no distension.     Tenderness: There is no abdominal tenderness.  Musculoskeletal: Normal range of motion.  Neurological:     Mental Status: He is alert and oriented to person, place, and time.  Psychiatric:        Behavior: Behavior normal.     ED Results and Treatments Labs (all labs ordered are listed, but only abnormal results are displayed) Labs Reviewed  COMPREHENSIVE METABOLIC PANEL - Abnormal; Notable for the following components:      Result Value   Potassium 3.1 (*)    Glucose, Bld 130 (*)    Calcium 8.6 (*)    All other components within normal limits  ETHANOL - Abnormal; Notable for the following components:   Alcohol, Ethyl (B) 197 (*)    All other components within normal limits  CBC WITH DIFFERENTIAL/PLATELET  LIPASE, BLOOD  EKG  EKG Interpretation   Date/Time:    Ventricular Rate:    PR Interval:    QRS Duration:   QT Interval:    QTC Calculation:   R Axis:     Text Interpretation:        Radiology No results found.  Pertinent labs & imaging results that were available during my care of the patient were reviewed by me and considered in my medical decision making (see chart for details).  Medications Ordered in ED Medications  ondansetron (ZOFRAN-ODT) disintegrating tablet 4 mg (4 mg Oral Given 04/09/19 0003)                                                                                                                                    Procedures Procedures  (including critical care time)  Medical Decision Making / ED Course I have reviewed the nursing notes for this encounter and the patient's prior records (if available in EHR or on provided paperwork).   Geoffrey [name] was evaluated in Emergency Department on 04/09/2019 for the symptoms described in the history of present illness. He was evaluated in the context of the global COVID-19 pandemic, which necessitated consideration that the patient might be at risk for infection with the SARS-CoV-2 virus that causes COVID-19. Institutional protocols and algorithms that pertain to the evaluation of patients at risk for COVID-19 are in a state of rapid change based on information released by regulatory bodies including the CDC and federal and state organizations. These policies and algorithms were followed during the patient's care in the ED.  Labs reassuring.  Abdomen benign.  Stable for DC with caregiver.      Final Clinical Impression(s) / ED Diagnoses Final diagnoses:  Alcoholic intoxication without complication (HCC)     The patient appears reasonably screened and/or stabilized for discharge and I doubt any other medical condition or other [facility] requiring further screening, evaluation, or treatment in the ED at this time prior to discharge.  Disposition: Discharge   Condition: Good  I have discussed the results, Dx and Tx plan with the patient who expressed understanding and agree(s) with the plan. Discharge instructions discussed at great length. The patient was given strict return precautions who verbalized understanding of the instructions. No further questions at time of discharge.    ED Discharge Orders    None        Follow Up: Raymon MuttonSmith, Fred A Jr., FNP [address] [place] [id number] [phone]   As needed     This chart was dictated using voice recognition software.  Despite best efforts to proofread,  errors can occur which can change the documentation meaning.   Nira Connardama, Divine Imber Eduardo, MD 04/09/19 [phone]

## 2019-04-09 NOTE — ED Notes (Signed)
Spoke with legal guardian about pt presentation and lab work. Pt guardian verbalized understanding. Pt alert and oriented and ambulatory without assistance upon discharge

## 2019-04-11 ENCOUNTER — Encounter: Payer: Self-pay | Admitting: Neurology

## 2019-04-11 NOTE — Telephone Encounter (Signed)
lvm x 2 c/a letter sent  

## 2019-04-25 ENCOUNTER — Ambulatory Visit: Payer: Self-pay | Admitting: Neurology

## 2019-05-01 ENCOUNTER — Encounter (HOSPITAL_COMMUNITY): Payer: Self-pay | Admitting: Emergency Medicine

## 2019-05-01 ENCOUNTER — Emergency Department (HOSPITAL_COMMUNITY)
Admission: EM | Admit: 2019-05-01 | Discharge: 2019-05-03 | Disposition: A | Discharge status: Home / Self Care | Payer: Medicare Other | Attending: Emergency Medicine | Admitting: Emergency Medicine

## 2019-05-01 DIAGNOSIS — R6250 Unspecified lack of expected normal physiological development in childhood: Secondary | ICD-10-CM | POA: Diagnosis present

## 2019-05-01 DIAGNOSIS — F840 Autistic disorder: Secondary | ICD-10-CM | POA: Diagnosis present

## 2019-05-01 DIAGNOSIS — Z79899 Other long term (current) drug therapy: Secondary | ICD-10-CM | POA: Insufficient documentation

## 2019-05-01 DIAGNOSIS — F17290 Nicotine dependence, other tobacco product, uncomplicated: Secondary | ICD-10-CM | POA: Diagnosis not present

## 2019-05-01 DIAGNOSIS — Z046 Encounter for general psychiatric examination, requested by authority: Secondary | ICD-10-CM

## 2019-05-01 DIAGNOSIS — F919 Conduct disorder, unspecified: Secondary | ICD-10-CM | POA: Diagnosis present

## 2019-05-01 DIAGNOSIS — G40909 Epilepsy, unspecified, not intractable, without status epilepticus: Secondary | ICD-10-CM | POA: Insufficient documentation

## 2019-05-01 DIAGNOSIS — F902 Attention-deficit hyperactivity disorder, combined type: Secondary | ICD-10-CM | POA: Diagnosis present

## 2019-05-01 DIAGNOSIS — R45850 Homicidal ideations: Secondary | ICD-10-CM | POA: Insufficient documentation

## 2019-05-01 DIAGNOSIS — Z20828 Contact with and (suspected) exposure to other viral communicable diseases: Secondary | ICD-10-CM | POA: Insufficient documentation

## 2019-05-01 DIAGNOSIS — R4689 Other symptoms and signs involving appearance and behavior: Secondary | ICD-10-CM

## 2019-05-01 DIAGNOSIS — F4325 Adjustment disorder with mixed disturbance of emotions and conduct: Secondary | ICD-10-CM | POA: Diagnosis present

## 2019-05-01 LAB — COMPREHENSIVE METABOLIC PANEL
ALT: 59 U/L — ABNORMAL HIGH (ref 0–44)
AST: 45 U/L — ABNORMAL HIGH (ref 15–41)
Albumin: 4.2 g/dL (ref 3.5–5.0)
Alkaline Phosphatase: 45 U/L (ref 38–126)
Anion gap: 7 (ref 5–15)
BUN: 11 mg/dL (ref 6–20)
CO2: 27 mmol/L (ref 22–32)
Calcium: 9.1 mg/dL (ref 8.9–10.3)
Chloride: 103 mmol/L (ref 98–111)
Creatinine, Ser: 0.88 mg/dL (ref 0.61–1.24)
GFR calc Af Amer: >60 mL/min (ref >60)
GFR calc non Af Amer: >60 mL/min (ref >60)
Glucose, Bld: 104 mg/dL — ABNORMAL HIGH (ref 70–99)
Potassium: 3.9 mmol/L (ref 3.5–5.1)
Sodium: 137 mmol/L (ref 135–145)
Total Bilirubin: 0.8 mg/dL (ref 0.3–1.2)
Total Protein: 7.5 g/dL (ref 6.5–8.1)

## 2019-05-01 LAB — CBC WITH DIFFERENTIAL/PLATELET
Abs Immature Granulocytes: 0.03 10*3/uL (ref 0.00–0.07)
Basophils Absolute: 0 10*3/uL (ref 0.0–0.1)
Basophils Relative: 0 %
Eosinophils Absolute: 0 10*3/uL (ref 0.0–0.5)
Eosinophils Relative: 0 %
HCT: 44.7 % (ref 39.0–52.0)
Hemoglobin: 15.1 g/dL (ref 13.0–17.0)
Immature Granulocytes: 0 %
Lymphocytes Relative: 15 %
Lymphs Abs: 1.2 10*3/uL (ref 0.7–4.0)
MCH: 31.9 pg (ref 26.0–34.0)
MCHC: 33.8 g/dL (ref 30.0–36.0)
MCV: 94.3 fL (ref 80.0–100.0)
Monocytes Absolute: 0.5 10*3/uL (ref 0.1–1.0)
Monocytes Relative: 6 %
Neutro Abs: 6.2 10*3/uL (ref 1.7–7.7)
Neutrophils Relative %: 79 %
Platelets: 192 10*3/uL (ref 150–400)
RBC: 4.74 MIL/uL (ref 4.22–5.81)
RDW: 11.9 % (ref 11.5–15.5)
WBC: 7.9 10*3/uL (ref 4.0–10.5)
nRBC: 0 % (ref 0.0–0.2)

## 2019-05-01 LAB — ETHANOL: Alcohol, Ethyl (B): <10 mg/dL (ref <10)
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
## 2019-05-01 NOTE — ED Notes (Signed)
On admission to the Acute unit pt is calm and cooperative and went to bed to sleep.
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
## 2019-05-01 NOTE — ED Notes (Signed)
IVC and 2 belonging bags given to [employer]
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
## 2019-05-01 NOTE — ED Notes (Signed)
Pt provided urinal at tableside to collect specimen for U/A when possible. [employer]

## 2019-05-01 NOTE — BH Assessment (Signed)
[facility] Assessment Progress Note  Case was staffed with [place] NP who recommended patient be observed and monitored.      

## 2019-05-02 ENCOUNTER — Other Ambulatory Visit: Payer: Self-pay

## 2019-05-02 ENCOUNTER — Encounter (HOSPITAL_COMMUNITY): Payer: Self-pay | Admitting: Registered Nurse

## 2019-05-02 DIAGNOSIS — F4325 Adjustment disorder with mixed disturbance of emotions and conduct: Secondary | ICD-10-CM

## 2019-05-02 DIAGNOSIS — F919 Conduct disorder, unspecified: Secondary | ICD-10-CM | POA: Diagnosis not present

## 2019-05-02 DIAGNOSIS — R4689 Other symptoms and signs involving appearance and behavior: Secondary | ICD-10-CM

## 2019-05-02 DIAGNOSIS — F840 Autistic disorder: Secondary | ICD-10-CM

## 2019-05-02 DIAGNOSIS — F902 Attention-deficit hyperactivity disorder, combined type: Secondary | ICD-10-CM

## 2019-05-02 DIAGNOSIS — R6250 Unspecified lack of expected normal physiological development in childhood: Secondary | ICD-10-CM

## 2019-05-02 LAB — SARS CORONAVIRUS 2 (TAT 6-24 HRS): SARS Coronavirus 2: NEGATIVE

## 2019-05-02 LAB — RAPID URINE DRUG SCREEN, HOSP PERFORMED
Amphetamines: NOT DETECTED
Barbiturates: NOT DETECTED
Benzodiazepines: NOT DETECTED
Cocaine: NOT DETECTED
Opiates: NOT DETECTED
Tetrahydrocannabinol: POSITIVE — AB

## 2019-05-02 LAB — CARBAMAZEPINE LEVEL, TOTAL: Carbamazepine Lvl: <2 ug/mL — ABNORMAL LOW (ref 4.0–12.0)

## 2019-05-02 LAB — VALPROIC ACID LEVEL: Valproic Acid Lvl: <10 ug/mL — ABNORMAL LOW (ref 50.0–100.0)

## 2019-05-02 MED ORDER — HYDROXYZINE HCL 25 MG PO TABS: 50.0000 mg | ORAL_TABLET | Freq: Every evening | ORAL | Status: DC | PRN

## 2019-05-02 MED ORDER — CARBAMAZEPINE 200 MG PO TABS
200.0000 mg | ORAL_TABLET | Freq: Two times a day (BID) | ORAL | Status: DC
  Administered 2019-05-02 – 2019-05-03 (×2): 200 mg via ORAL
  Filled 2019-05-02 (×2): qty 1

## 2019-05-02 MED ORDER — DIVALPROEX SODIUM ER 250 MG PO TB24
250.0000 mg | ORAL_TABLET | Freq: Every day | ORAL | Status: DC
  Administered 2019-05-02: 250 mg via ORAL
  Filled 2019-05-02: qty 1

## 2019-05-02 MED ORDER — BENZTROPINE MESYLATE 0.5 MG PO TABS
0.5000 mg | ORAL_TABLET | Freq: Two times a day (BID) | ORAL | Status: DC
  Administered 2019-05-02 – 2019-05-03 (×2): 0.5 mg via ORAL
  Filled 2019-05-02 (×2): qty 1

## 2019-05-02 MED ORDER — CARBAMAZEPINE 200 MG PO TABS: 200.0000 mg | ORAL_TABLET | Freq: Two times a day (BID) | ORAL | Status: DC

## 2019-05-02 MED ORDER — RISPERIDONE 1 MG PO TABS
1.0000 mg | ORAL_TABLET | Freq: Every day | ORAL | Status: DC
  Administered 2019-05-03: 1 mg via ORAL
  Filled 2019-05-02: qty 1

## 2019-05-02 MED ORDER — RISPERIDONE 2 MG PO TABS
4.0000 mg | ORAL_TABLET | Freq: Every day | ORAL | Status: DC
  Administered 2019-05-02: 4 mg via ORAL
  Filled 2019-05-02: qty 2

## 2019-05-02 MED ORDER — CLONIDINE HCL 0.1 MG PO TABS: 0.1000 mg | ORAL_TABLET | Freq: Two times a day (BID) | ORAL | Status: DC

## 2019-05-02 NOTE — ED Notes (Addendum)
Pt has hand tremors. Reported to [facility] Rankin NP.

## 2019-05-02 NOTE — ED Notes (Signed)
Has slept all of shift so far only awake when staff woke him for vitals and to offer and give him food and drink. He is pleasant but minimal. Offered no complaints. Returned to sleep.

## 2019-05-02 NOTE — ED Notes (Signed)
Pt has cough and seems to have some nasal congestion.  Denies sore throat or difficulty swallowing.

## 2019-05-02 NOTE — ED Notes (Signed)
Visualized pt's throat with a light and there was no redness or swelling.

## 2019-05-02 NOTE — ED Notes (Signed)
Pt rested throughout night without any distress. Pt ambulated to restroom without difficulty. No aggressive behaviors noted during shift.

## 2019-05-02 NOTE — ED Notes (Signed)
Pt sleeping. No cooperative with Covid test at this time.

## 2019-05-02 NOTE — BH Assessment (Signed)
[facility] Assessment Progress Note  Per Shuvon Rankin, FNP, this pt requires psychiatric hospitalization at this time.  Pt presents under IVC initiated by pt's mother, which Dr Parke Poisson has upheld.  Placement will be sought for pt.  At this writer called pt's mother/legal guardian, Marga Melnick, to notify her of pt's disposition.  Pt's nurse, Diane, has been notified.  Jalene Mullet, [place] Coordinator [phone]
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
## 2019-05-03 DIAGNOSIS — F919 Conduct disorder, unspecified: Secondary | ICD-10-CM | POA: Diagnosis not present

## 2019-05-03 MED ORDER — BENZTROPINE MESYLATE 0.5 MG PO TABS: 0.5000 mg | ORAL_TABLET | Freq: Two times a day (BID) | ORAL | 0 refills | Status: DC

## 2019-05-03 MED ORDER — RISPERIDONE 1 MG PO TABS: 1.0000 mg | ORAL_TABLET | Freq: Every day | ORAL | 0 refills | Status: DC

## 2019-05-03 MED ORDER — CARBAMAZEPINE 200 MG PO TABS: 200.0000 mg | ORAL_TABLET | Freq: Two times a day (BID) | ORAL | 0 refills | Status: DC

## 2019-05-03 MED ORDER — DIVALPROEX SODIUM ER 250 MG PO TB24: 250.0000 mg | ORAL_TABLET | Freq: Every day | ORAL | 0 refills | Status: DC

## 2019-05-03 NOTE — Progress Notes (Signed)
CSW received a call from disposition who states pt is psychiatrically cleared and ready for D/C.  Provider is available for collateral info, per disposition, at [phone] should D/C require questions to be answered (do not give the number to family, please).  Per Disposition, pt is limited and mother should not only be notified but requested to p/u pt.  CSW will continue to follow for D/C needs.  Alphonse Guild. Sable Knoles, LCSW, LCAS, CSI Transitions of Care Clinical Social Worker Care Coordination Department Ph: [phone]

## 2019-05-03 NOTE — Discharge Instructions (Signed)
For your mental health needs, you are advised to follow up with [facility].  Call them at your earliest opportunity to schedule an intake appointment:       [place]      [place], [place] [address]      [phone]      Crisis number: [phone]  You are also advised to continue with your [facility] care coordinator       [place]      [facility]      [phone]
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
## 2019-05-03 NOTE — BH Assessment (Addendum)
[facility] Assessment Progress Note  Per Shuvon Rankin, FNP, this pt does not require psychiatric hospitalization at this time.  Pt presents under IVC initiated by pt's mother and initially upheld by Neita Garnet, MD, but now rescinded by him.  Pt is to be discharged from [facility].  Pt's TTS Assessment indicates that pt does not have a current outpatient provider, but previous notes by this writer show that I have advised that pt follow up with [facility] and with his [facility].  This recommendation has been included in pt's current discharge instructions.  At 15:54 I called pt's [facility] coordinator, Dexter [phone]), to notify him of pt's disposition.  Call rolled to voice mail and I left a message.  At 16:01 I called pt's mother/legal guardian Marga Melnick [phone]), also to notify her.  Again, call rolled to voice mail and I left a message.  Pt's nurse, Nena Jordan, has been notified.  I have also left a message for social worker by voice mail.  Jalene Mullet, MA Triage Specialist [phone]   Addendum:  At 16:42 Roshana calls back and I informed her of pt's disposition.  She reports that she will be at [facility] in a little over an hour to pick pt up.  Nena Jordan has been notified.  Jalene Mullet, [place] Coordinator [phone]

## 2019-05-03 NOTE — Consult Note (Addendum)
[facility] Psych ED Discharge  05/03/2019 2:57 PM Geoffrey [name]  MRN:  [id number] Principal Problem: Adjustment disorder with mixed disturbance of emotions and conduct Discharge Diagnoses: Principal Problem:   Adjustment disorder with mixed disturbance of emotions and conduct Active Problems:   Delay in development   ADHD (attention deficit hyperactivity disorder), combined type   Autism   Aggressive behavior   Subjective: Geoffrey [name], 23 y.o., male patient seen via tele psych by this provider, Dr. ; and chart reviewed on 05/03/19.  On evaluation Geoffrey [name] reports reports he is feeling fine ready to go home.  Patient reports that he is eating and sleeping without any difficulty taking his medications without any adverse reactions.  Patient denies suicidal/self-harm/homicidal ideations, psychosis, paranoia.  Patient has outpatient psychiatric services and a care coordinator through [facility].  Patient reports that his medications is managed at home by his mother. During evaluation Geoffrey [name] is alert/oriented x 4; calm/cooperative; and mood is congruent with affect.  He does not appear to be responding to internal/external stimuli or delusional thoughts.  Patient denies suicidal/self-harm/homicidal ideation, psychosis, and paranoia.  Patient answered question appropriately.   Spoke with patient's nurse who states patient has been very well mannered there has been no verbal or physical behavioral outburst since the patient has been in the ED.  This is patient's fifth day of treatment.  Patient was transferred to [facility] long ED from [place] after being in [place] for 2 to 3 days patient has been psychiatrically cleared to return home.  Total Time spent with patient: 30 minutes  Past Psychiatric History: See above  Past Medical History:  Past Medical History:  Diagnosis Date  . ADHD (attention deficit hyperactivity disorder)   . Aggressive behavior   . Autism   . Conduct disorder   .  Delay in development   . Depression   . Epilepsy (HCC)   . Essential tremor   . 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 Nicotine dependence   . Seizures (HCC)     Past Surgical History:  Procedure Laterality Date  . NO PAST SURGERIES     Family History:  Family History  Problem Relation Age of Onset  . Epilepsy Father   . Cerebral palsy Brother   . Healthy Brother   . Asthma Other   . Cancer Other   . Diabetes Other   . Stroke Other   . COPD Other   . Heart attack Other    Family Psychiatric  History: Unaware Social History:  Social History   Substance and Sexual Activity  Alcohol Use Yes   Comment: socially     Social History   Substance and Sexual Activity  Drug Use Yes   Comment: Patient denies     Social History   Socioeconomic History  . Marital status: Single    Spouse name: Not on file  . Number of children: Not on file  . Years of education: Not on file  . Highest education level: Not on file  Occupational History  . Not on file  Social Needs  . Financial resource strain: Not on file  . Food insecurity    Worry: Not on file    Inability: Not on file  . Transportation needs    Medical: Not on file    Non-medical: Not on file  Tobacco Use  . Smoking status: Current Every Day Smoker    Years: 2.00    Types: Cigars  . Smokeless tobacco: Never Used  Substance and  Sexual Activity  . Alcohol use: Yes    Comment: socially  . Drug use: Yes    Comment: Patient denies   . Sexual activity: Never  Lifestyle  . Physical activity    Days per week: Not on file    Minutes per session: Not on file  . Stress: Not on file  Relationships  . Social Musicianconnections    Talks on phone: Not on file    Gets together: Not on file    Attends religious service: Not on file    Active member of club or organization: Not on file    Attends meetings of clubs or organizations: Not on file    Relationship status: Not on file  Other Topics Concern  . Not on file  Social History  Narrative  . Not on file    Has this patient used any form of tobacco in the last 30 days? (Cigarettes, Smokeless Tobacco, Cigars, and/or Pipes) A prescription for an FDA-approved tobacco cessation medication was offered at discharge and the patient refused  Current Medications: Current Facility-Administered Medications  Medication Dose Route Frequency Provider Last Rate Last Dose  . benztropine (COGENTIN) tablet 0.5 mg  0.5 mg Oral BID Rankin, Shuvon B, NP   0.5 mg at 05/03/19 1055  . carbamazepine (TEGRETOL) tablet 200 mg  200 mg Oral BID Rankin, Shuvon B, NP   200 mg at 05/03/19 1055  . cloNIDine (CATAPRES) tablet 0.1 mg  0.1 mg Oral BID Rankin, Shuvon B, NP   Stopped at 05/03/19 1056  . divalproex (DEPAKOTE ER) 24 hr tablet 250 mg  250 mg Oral QHS Rankin, Shuvon B, NP   250 mg at 05/02/19 2141  . hydrOXYzine (ATARAX/VISTARIL) tablet 50-100 mg  50-100 mg Oral QHS PRN Rankin, Shuvon B, NP      . risperiDONE (RISPERDAL) tablet 1 mg  1 mg Oral Daily Rankin, Shuvon B, NP   1 mg at 05/03/19 1055   Current Outpatient Medications  Medication Sig Dispense Refill  . cloNIDine (CATAPRES) 0.1 MG tablet Take 1 tablet (0.1 mg total) by mouth 2 (two) times daily. 60 tablet 0  . hydrOXYzine (ATARAX/VISTARIL) 50 MG tablet Take 50-100 mg by mouth at bedtime as needed (insomnia).     Marland Kitchen. ketoconazole (NIZORAL) 2 % cream Apply 1 application topically daily. For 2 weeks    . benztropine (COGENTIN) 0.5 MG tablet Take 1 tablet (0.5 mg total) by mouth 2 (two) times daily. 60 tablet 0  . carbamazepine (TEGRETOL) 200 MG tablet Take 1 tablet (200 mg total) by mouth 2 (two) times daily. 30 tablet 0  . divalproex (DEPAKOTE ER) 250 MG 24 hr tablet Take 1 tablet (250 mg total) by mouth at bedtime. 30 tablet 0  . [START ON 05/04/2019] risperiDONE (RISPERDAL) 1 MG tablet Take 1 tablet (1 mg total) by mouth daily. 30 tablet 0   PTA Medications: (Not in a hospital admission)   Musculoskeletal: Strength & Muscle Tone:  within normal limits Gait & Station: normal Patient leans: N/A  Psychiatric Specialty Exam: Physical Exam  Nursing note and vitals reviewed. Constitutional: He is oriented to person, place, and time. He appears well-developed and well-nourished. No distress.  Neck: Normal range of motion.  Respiratory: Effort normal.  Musculoskeletal: Normal range of motion.  Neurological: He is alert and oriented to person, place, and time.  Psychiatric: He has a normal mood and affect. His speech is normal and behavior is normal. Judgment and thought content normal. Cognition and memory  are normal.    Review of Systems  Psychiatric/Behavioral: Depression:  Denies. Hallucinations:  Denies. Memory loss:  Denies. Substance abuse:  " Weak sometimes" Suicidal ideas:  Denies. Nervous/anxious:  Denies. Insomnia:  Denies.   All other systems reviewed and are negative.   Blood pressure (!) 103/54, pulse 87, temperature 98.8 F (37.1 C), temperature source Oral, resp. rate 16, height 6\' 2"  (1.88 m), weight 65.8 kg, SpO2 97 %.Body mass index is 18.62 kg/m.  General Appearance: Casual  Eye Contact:  Good  Speech:  Clear and Coherent and Normal Rate  Volume:  Normal  Mood:  "Good."  Appropriate  Affect:  Appropriate and Congruent  Thought Process:  Coherent and Goal Directed  Orientation:  Full (Time, Place, and Person)  Thought Content:  WDL and Patient denies hallucinations, delusions, and paranoia.  Suicidal Thoughts:  No  Homicidal Thoughts:  No  Memory:  Immediate;   Fair Recent;   Fair  Judgement:  Fair  Insight:  Present  Psychomotor Activity:  Normal  Concentration:  Concentration: Fair and Attention Span: Fair  Recall:  [employer] of Knowledge:  Fair  Language:  Good  Akathisia:  No patient has some tremors in hands yesterday was started on Cogentin better today  Handed:  Right  AIMS (if indicated):     Assets:  Communication Skills Housing Social Support  ADL's:  Intact  Cognition:   WNL  Sleep:        Demographic Factors:  Male  Loss Factors: NA  Historical Factors: Impulsivity  Risk Reduction Factors:   Religious beliefs about death, Living with another person, especially a relative, Positive social support and Positive therapeutic relationship  Continued Clinical Symptoms:  Previous Psychiatric Diagnoses and Treatments  Cognitive Features That Contribute To Risk:  None    Suicide Risk:  Minimal: No identifiable suicidal ideation.  Patients presenting with no risk factors but with morbid ruminations; may be classified as minimal risk based on the severity of the depressive symptoms    Plan Of Care/Follow-up recommendations:  Activity:  As tolerated Diet:  Heart healthy Other:  Follow-up with current outpatient psychiatric provider  Disposition: No evidence of imminent risk to self or others at present.   Patient does not meet criteria for psychiatric inpatient admission. Supportive therapy provided about ongoing stressors. Discussed crisis plan, support from social network, calling 911, coming to the Emergency Department, and calling Suicide Hotline.   Discharge Instructions   None     Shuvon Rankin, NP 05/03/2019, 2:57 PM   Attest to NP note

## 2019-05-03 NOTE — ED Notes (Signed)
Pts mother called and will pick him up in about an hour.

## 2021-05-01 ENCOUNTER — Other Ambulatory Visit: Payer: Self-pay

## 2021-05-01 ENCOUNTER — Ambulatory Visit
Admission: EM | Admit: 2021-05-01 | Discharge: 2021-05-01 | Disposition: A | Discharge status: Home / Self Care | Payer: Medicare Other | Attending: Emergency Medicine | Admitting: Emergency Medicine

## 2021-05-01 DIAGNOSIS — J310 Chronic rhinitis: Secondary | ICD-10-CM | POA: Diagnosis not present

## 2021-05-01 DIAGNOSIS — J4520 Mild intermittent asthma, uncomplicated: Secondary | ICD-10-CM

## 2021-05-01 MED ORDER — IPRATROPIUM BROMIDE 0.03 % NA SOLN: 2.0000 | Freq: Two times a day (BID) | NASAL | 0 refills | Status: DC

## 2021-05-01 NOTE — ED Triage Notes (Signed)
Pt report getting a check up for flu and covid (pt reports having a cough) Started: a week and a half ago

## 2021-05-01 NOTE — Discharge Instructions (Addendum)
For the next 3 to 4 days, please use your albuterol inhaler 2 puffs twice daily until the cough is improved and resolved.    I also recommend that you begin using Atrovent nasal spray to decrease the inflammation in your nose and reduce the nasal drainage is running down the back of your throat, causing a tickle, making you cough.  I have sent a prescription for Atrovent to your pharmacy, spray 2 sprays into each nare up to 4 times daily as needed until your mucous membranes feel dry and you no longer have a runny nose.  If you not seeing significant improvement in the next 3 to 5 days, please follow-up with your primary care provider or return to urgent care for further evaluation.  You were tested today for:  COVID Influenza  Please continue to isolate at home until you receive the results of his/her/your tests.  The results of viral testing will be made available through your MyChart account as well as by phone, this usually takes 24 to 48 hours.    Keep in mind that it is recommend that he/she/you should self isolate for at least 3 days after your symptoms began AND he/she/you is/are fever free and symptom free without the use of fever reducing medications such as Tylenol and ibuprofen.    Please also keep in mind is also important to self isolate within your household and to wear a mask around other people at home on days 4 through 10 after your symptoms began.  For more information, please read to the [facility] website regarding COVID, influenza and RSV isolation guidelines.

## 2021-05-01 NOTE — ED Provider Notes (Signed)
[facility]    CSN: [id number] Arrival date & time: 05/01/21  1248   History   Chief Complaint No chief complaint on file.  HPI  Geoffrey [name] is a 25 y.o. male. Patient is here with his caregiver today who states patient has had a cough for the past week and a half or so comes as the cough is loud and barky and sounds rough.  Patient states the cough is not bothering him.  Patient and caregiver state patient has not had any fever, aches, chills, nausea, vomiting, diarrhea.  Caregiver states patient requested to come here today because he wanted to make sure he does not have flu or COVID.  Patient and caregiver deny known exposures to COVID and influenza.  The history is provided by the patient and a caregiver.   Past Medical History:  Diagnosis Date   ADHD (attention deficit hyperactivity disorder)    Aggressive behavior    Autism    Conduct disorder    Delay in development    Depression    Epilepsy (HCC)    Essential tremor    Lung granuloma (HCC)    Nicotine dependence    Seizures (HCC)    Patient Active Problem List   Diagnosis Date Noted   Tremor 01/23/2019   Seizures (HCC) 01/23/2019   Polypharmacy 01/23/2019   Adjustment disorder with mixed disturbance of emotions and conduct 10/21/2015   Involuntary commitment    Aggression 10/02/2014   Autism 10/02/2014   Aggressive behavior    Oppositional defiant disorder 05/20/2013   Post traumatic stress disorder (PTSD) 05/20/2013   Excessive anger 05/20/2013   ADD (attention deficit disorder) 05/20/2013   Vitamin D insufficiency 12/30/2012   Loss of weight 12/30/2012   Abnormal weight loss 12/21/2012   Intellectual disability 11/03/2012   ADHD (attention deficit hyperactivity disorder), 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   Sleep disorder 11/03/2012   Language disorder involving understanding and expression of language 11/03/2012   Environmental allergies 11/03/2012   Delay in development    Other convulsions  10/05/2012   Essential and other specified forms of tremor 10/05/2012   Unspecified mental or behavioral problem 10/05/2012   Undersocialized conduct disorder, aggressive type, unspecified 10/05/2012   Past Surgical History:  Procedure Laterality Date   NO PAST SURGERIES      Home Medications    Prior to Admission medications   Medication Sig Start Date End Date Taking? Authorizing Provider  ipratropium (ATROVENT) 0.03 % nasal spray Place 2 sprays into both nostrils every 12 (twelve) hours. 05/01/21  Yes Theadora Rama Scales, PA-C  benztropine (COGENTIN) 0.5 MG tablet Take 1 tablet (0.5 mg total) by mouth 2 (two) times daily. 05/03/19   Rankin, Shuvon B, NP  carbamazepine (TEGRETOL) 200 MG tablet Take 1 tablet (200 mg total) by mouth 2 (two) times daily. 05/03/19   Rankin, Shuvon B, NP  cloNIDine (CATAPRES) 0.1 MG tablet Take 1 tablet (0.1 mg total) by mouth 2 (two) times daily. 10/05/14   Earney Navy, NP  divalproex (DEPAKOTE ER) 250 MG 24 hr tablet Take 1 tablet (250 mg total) by mouth at bedtime. 05/03/19   Rankin, Shuvon B, NP  hydrOXYzine (ATARAX/VISTARIL) 50 MG tablet Take 50-100 mg by mouth at bedtime as needed (insomnia).  02/16/19   [provider]  ketoconazole (NIZORAL) 2 % cream Apply 1 application topically daily. For 2 weeks 04/17/19   [provider]  risperiDONE (RISPERDAL) 1 MG tablet Take 1 tablet (1 mg total) by mouth  daily. 05/04/19   Rankin, Rada Hay, NP   Family History Family History  Problem Relation Age of Onset   Epilepsy Father    Cerebral palsy Brother    Healthy Brother    Asthma Other    Cancer Other    Diabetes Other    Stroke Other    COPD Other    Heart attack Other    Social History Social History   Tobacco Use   Smoking status: Every Day    Types: Cigars   Smokeless tobacco: Never  Substance Use Topics   Alcohol use: Yes    Comment: socially   Drug use: Yes    Comment: Patient denies    Allergies   Patient  has no known allergies.  Review of Systems Review of SystemsPertinent findings noted in history of present illness.   Physical Exam Triage Vital Signs ED Triage Vitals  Enc Vitals Group     BP 04/18/21 0827 (!) 147/82     Pulse Rate 04/18/21 0827 72     Resp 04/18/21 0827 18     Temp 04/18/21 0827 98.3 F (36.8 C)     Temp Source 04/18/21 0827 Oral     SpO2 04/18/21 0827 98 %     Weight --      Height --      Head Circumference --      Peak Flow --      Pain Score 04/18/21 0826 5     Pain Loc --      Pain Edu? --      Excl. in GC? --    No data found.  Updated Vital Signs BP 131/76 (BP Location: Right Arm)   Pulse 76   Temp 98.1 F (36.7 C) (Oral)   Resp 18   SpO2 96%   Visual Acuity Right Eye Distance:   Left Eye Distance:   Bilateral Distance:    Right Eye Near:   Left Eye Near:    Bilateral Near:     Physical Exam Vitals and nursing note reviewed.  Constitutional:      General: He is not in acute distress.    Appearance: Normal appearance. He is not ill-appearing.  HENT:     Head: Normocephalic and atraumatic.     Salivary Glands: Right salivary gland is not diffusely enlarged or tender. Left salivary gland is not diffusely enlarged or tender.     Right Ear: Tympanic membrane, ear canal and external ear normal. No drainage. No middle ear effusion. There is no impacted cerumen. Tympanic membrane is not erythematous or bulging.     Left Ear: Tympanic membrane, ear canal and external ear normal. No drainage.  No middle ear effusion. There is no impacted cerumen. Tympanic membrane is not erythematous or bulging.     Nose: Congestion and rhinorrhea present. No nasal deformity, septal deviation or mucosal edema. Rhinorrhea is clear.     Right Turbinates: Enlarged and swollen. Not pale.     Left Turbinates: Enlarged and swollen. Not pale.     Right Sinus: No maxillary sinus tenderness or frontal sinus tenderness.     Left Sinus: No maxillary sinus tenderness or  frontal sinus tenderness.     Mouth/Throat:     Lips: Pink. No lesions.     Mouth: Mucous membranes are moist. No oral lesions.     Pharynx: Oropharynx is clear. Uvula midline. No posterior oropharyngeal erythema or uvula swelling.     Tonsils: No tonsillar exudate. 0  on the right. 0 on the left.  Eyes:     General: Lids are normal.        Right eye: No discharge.        Left eye: No discharge.     Extraocular Movements: Extraocular movements intact.     Conjunctiva/sclera: Conjunctivae normal.     Right eye: Right conjunctiva is not injected.     Left eye: Left conjunctiva is not injected.  Neck:     Trachea: Trachea and phonation normal.  Cardiovascular:     Rate and Rhythm: Normal rate and regular rhythm.     Pulses: Normal pulses.     Heart sounds: Normal heart sounds. No murmur heard.   No friction rub. No gallop.  Pulmonary:     Effort: Pulmonary effort is normal. No accessory muscle usage, prolonged expiration or respiratory distress.     Breath sounds: No stridor, decreased air movement or transmitted upper airway sounds. Decreased breath sounds present. No wheezing, rhonchi or rales.  Chest:     Chest wall: No tenderness.  Musculoskeletal:        General: Normal range of motion.     Cervical back: Normal range of motion and neck supple. Normal range of motion.  Lymphadenopathy:     Cervical: No cervical adenopathy.  Skin:    General: Skin is warm and dry.     Findings: No erythema or rash.  Neurological:     General: No focal deficit present.     Mental Status: He is alert and oriented to person, place, and time.  Psychiatric:        Mood and Affect: Mood normal.        Behavior: Behavior normal.   UC Treatments / Results  Labs (all labs ordered are listed, but only abnormal results are displayed) Labs Reviewed  COVID-19, FLU A+B NAA    EKG  Radiology No results found.  Procedures Procedures (including critical care time)  Medications Ordered in  UC Medications - No data to display  Initial Impression / Assessment and Plan / UC Course  I have reviewed the triage vital signs and the nursing notes.  Pertinent labs & imaging results that were available during my care of the patient were reviewed by me and considered in my medical decision making (see chart for details).     Patient advised to increase the use of his albuterol inhaler, caregiver states he currently does not use it at all.  Patient advised to use 2 inhalations at least twice a day for the next several days until he is over this cough.  Patient also advised that it would be a good idea for him to begin a nasal steroid to reduce the inflammation in his nose and decrease the amount of postnasal drip which might be the source of his cough.  Final Clinical Impressions(s) / UC Diagnoses   Final diagnoses:  Mild intermittent reactive airway disease  Rhinitis, unspecified type     Discharge Instructions      For the next 3 to 4 days, please use your albuterol inhaler 2 puffs twice daily until the cough is improved and resolved.    I also recommend that you begin using Atrovent nasal spray to decrease the inflammation in your nose and reduce the nasal drainage is running down the back of your throat, causing a tickle, making you cough.  I have sent a prescription for Atrovent to your pharmacy, spray 2 sprays into each nare up  to 4 times daily as needed until your mucous membranes feel dry and you no longer have a runny nose.  If you not seeing significant improvement in the next 3 to 5 days, please follow-up with your primary care provider or return to urgent care for further evaluation.  You were tested today for:  COVID Influenza  Please continue to isolate at home until you receive the results of his/her/your tests.  The results of viral testing will be made available through your MyChart account as well as by phone, this usually takes 24 to 48 hours.    Keep in mind  that it is recommend that he/she/you should self isolate for at least 3 days after your symptoms began AND he/she/you is/are fever free and symptom free without the use of fever reducing medications such as Tylenol and ibuprofen.    Please also keep in mind is also important to self isolate within your household and to wear a mask around other people at home on days 4 through 10 after your symptoms began.  For more information, please read to the [facility] website regarding COVID, influenza and RSV isolation guidelines.       ED Prescriptions     Medication Sig Dispense Auth. Provider   ipratropium (ATROVENT) 0.03 % nasal spray Place 2 sprays into both nostrils every 12 (twelve) hours. 30 mL Theadora Rama Scales, PA-C      PDMP not reviewed this encounter.   Disposition Upon Discharge:  Patient presented with an acute illness with associated systemic symptoms and significant discomfort requiring urgent management. In my opinion, this is a condition that a prudent lay person (someone who possesses an average knowledge of health and medicine) may potentially expect to result in complications if not addressed urgently such as respiratory distress, impairment of bodily function or dysfunction of bodily organs.   Routine symptom specific, illness specific and/or disease specific instructions were discussed with the patient and/or caregiver at length.   As such, the patient has been evaluated and assessed, work-up was performed and treatment was provided in alignment with urgent care protocols and evidence based medicine.  Patient/parent/caregiver has been advised that the patient may require follow up for further testing and treatment if the symptoms continue in spite of treatment, as clinically indicated and appropriate.  Patient was tested for COVID-19, Influenza and/or RSV, the patient/parent/guardian was advised to isolate at home pending the results of his/her diagnostic coronavirus test and  potentially longer if they're positive. Advised pt that if his/her COVID-19 test returns positive, it's recommended to self-isolate for at least 10 days after symptoms first appeared AND until fever-free for 24 hours without fever reducer AND other symptoms have improved or resolved. Discussed self-isolation recommendations as well as instructions for household member/close contacts as per the [facility] and [place] DHHS, and also gave patient the COVID packet with this information.  Patient/parent/caregiver has been advised to return to the [facility] or PCP in 3-5 days if no better; to PCP or the Emergency Department if new signs and symptoms develop, or if the current signs or symptoms continue to change or worsen for further workup, evaluation and treatment as clinically indicated and appropriate  The patient will follow up with their current PCP if and as advised. If the patient does not currently have a PCP we will assist them in obtaining one.   Patient/parent/caregiver verbalized understanding and agreement of plan as discussed.  All questions were addressed during visit.  Please see discharge instructions below for  further details of plan.  Condition: stable for discharge home Home: take medications as prescribed; routine discharge instructions as discussed; follow up as advised.    Theadora Rama Scales, PA-C 05/01/21 1551

## 2021-05-02 LAB — COVID-19, FLU A+B NAA
Influenza A, NAA: NOT DETECTED
Influenza B, NAA: NOT DETECTED
SARS-CoV-2, NAA: NOT DETECTED

## 2022-07-02 ENCOUNTER — Ambulatory Visit
Admission: EM | Admit: 2022-07-02 | Discharge: 2022-07-02 | Disposition: A | Discharge status: Home / Self Care | Payer: 59 | Attending: Urgent Care | Admitting: Urgent Care

## 2022-07-02 DIAGNOSIS — L0231 Cutaneous abscess of buttock: Secondary | ICD-10-CM

## 2022-07-02 MED ORDER — IBUPROFEN 600 MG PO TABS: 600.0000 mg | ORAL_TABLET | Freq: Four times a day (QID) | ORAL | 0 refills | Status: DC | PRN

## 2022-07-02 MED ORDER — DOXYCYCLINE HYCLATE 100 MG PO CAPS: 100.0000 mg | ORAL_CAPSULE | Freq: Two times a day (BID) | ORAL | 0 refills | Status: DC

## 2022-07-02 NOTE — ED Triage Notes (Signed)
Pt c/o abscess to center upper buttocks for over a week. Caregiver states noticed bloody drainage and difficulty sitting at times.

## 2022-07-02 NOTE — ED Provider Notes (Signed)
[facility] - URGENT [facility]ARE [facility]  Note:  This document was prepared using Systems analyst and may include unintentional dictation errors.  MRN: [id number] DOB: 1996-02-19  Subjective:   Geoffrey [name] is a 27 y.o. male presenting for 1 history of persistent bloody drainage of the buttock area.  Area is mildly tender.  Has noticed that spot that is swollen.  No history of pilonidal cyst.  No current facility-administered medications for this encounter.  Current Outpatient Medications:    benztropine (COGENTIN) 0.5 MG tablet, Take 1 tablet (0.5 mg total) by mouth 2 (two) times daily., Disp: 60 tablet, Rfl: 0   carbamazepine (TEGRETOL) 200 MG tablet, Take 1 tablet (200 mg total) by mouth 2 (two) times daily., Disp: 30 tablet, Rfl: 0   cloNIDine (CATAPRES) 0.1 MG tablet, Take 1 tablet (0.1 mg total) by mouth 2 (two) times daily., Disp: 60 tablet, Rfl: 0   divalproex (DEPAKOTE ER) 250 MG 24 hr tablet, Take 1 tablet (250 mg total) by mouth at bedtime., Disp: 30 tablet, Rfl: 0   hydrOXYzine (ATARAX/VISTARIL) 50 MG tablet, Take 50-100 mg by mouth at bedtime as needed (insomnia). , Disp: , Rfl:    ipratropium (ATROVENT) 0.03 % nasal spray, Place 2 sprays into both nostrils every 12 (twelve) hours., Disp: 30 mL, Rfl: 0   ketoconazole (NIZORAL) 2 % cream, Apply 1 application topically daily. 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[place])    Nicotine dependence    Seizures ([place])      Past Surgical History:  Procedure Laterality Date   NO PAST SURGERIES      Family History  Problem Relation Age of Onset   Epilepsy  Father    Cerebral palsy Brother    Healthy Brother    Asthma Other    Cancer Other    Diabetes Other    Stroke Other    COPD Other    Heart attack Other     Social History   Tobacco Use   Smoking status: Every Day    Types: Cigars   Smokeless tobacco: Never  Substance Use Topics   Alcohol use: Not Currently    Comment: socially   Drug use: Not Currently    Comment: Patient denies     ROS   Objective:   Vitals: BP 128/83 (BP Location: Right Arm)   Pulse 90   Temp 97.8 F (36.6 C) (Oral)   Resp 18   SpO2 95%   Physical Exam Constitutional:      General: He is not in acute distress.    Appearance: Normal appearance. He is well-developed and normal weight. He is not ill-appearing, toxic-appearing or diaphoretic.  HENT:     Head: Normocephalic and atraumatic.     Right Ear: External ear normal.     Left Ear: External ear normal.     Nose: Nose normal.     Mouth/Throat:     Pharynx: Oropharynx is clear.  Eyes:     General: No scleral icterus.  Right eye: No discharge.        Left eye: No discharge.     Extraocular Movements: Extraocular movements intact.  Cardiovascular:     Rate and Rhythm: Normal rate.  Pulmonary:     Effort: Pulmonary effort is normal.  Musculoskeletal:     Cervical back: Normal range of motion.  Skin:      Neurological:     Mental Status: He is alert and oriented to person, place, and time.  Psychiatric:        Mood and Affect: Mood normal.        Behavior: Behavior normal.        Thought Content: Thought content normal.        Judgment: Judgment normal.    PROCEDURE NOTE: I&D of Abscess Verbal consent obtained. Local anesthesia with topical spray. Site cleansed with alcohol swabs. Incision of 1/2cm was made using an 11 blade, 3cc expressed consisting of a mixture of pus and serosanguinous fluid. Wound cavity was explored with curved hemostats and loculations loosened. Cleansed and dressed.   Assessment and Plan :    PDMP not reviewed this encounter.  1. Abscess of buttock, left     Successful I&D performed.  Wound care reviewed.  Start doxycycline for the abscess, ibuprofen for pain and inflammation. Counseled patient on potential for adverse effects with medications prescribed/recommended today, ER and return-to-clinic precautions discussed, patient verbalized understanding.    Jaynee Eagles, PA-C 07/02/22 1141

## 2022-07-02 NOTE — Discharge Instructions (Signed)
Please change your dressing 3-5 times daily. Do not apply any ointments or creams. Each time you change your dressing, make sure that you are pressing on the wound to get pus to come out.  Try your best to clean the wound with antibacterial soap and warm water. Pat your wound dry and let it air out if possible to make sure it is dry before reapplying another dressing.   Start doxycycline for the infection. Use ibuprofen for the pain.   

## 2023-02-12 ENCOUNTER — Emergency Department (HOSPITAL_COMMUNITY): Payer: 59

## 2023-02-12 ENCOUNTER — Emergency Department (HOSPITAL_COMMUNITY)
Admission: EM | Admit: 2023-02-12 | Discharge: 2023-02-13 | Disposition: A | Discharge status: Home / Self Care | Payer: 59 | Attending: Student | Admitting: Student

## 2023-02-12 DIAGNOSIS — X838XXA Intentional self-harm by other specified means, initial encounter: Secondary | ICD-10-CM | POA: Diagnosis not present

## 2023-02-12 DIAGNOSIS — F909 Attention-deficit hyperactivity disorder, unspecified type: Secondary | ICD-10-CM | POA: Diagnosis not present

## 2023-02-12 DIAGNOSIS — F840 Autistic disorder: Secondary | ICD-10-CM | POA: Diagnosis not present

## 2023-02-12 DIAGNOSIS — F17290 Nicotine dependence, other tobacco product, uncomplicated: Secondary | ICD-10-CM | POA: Diagnosis not present

## 2023-02-12 DIAGNOSIS — S60052A Contusion of left little finger without damage to nail, initial encounter: Secondary | ICD-10-CM | POA: Diagnosis not present

## 2023-02-12 DIAGNOSIS — R4689 Other symptoms and signs involving appearance and behavior: Secondary | ICD-10-CM | POA: Diagnosis present

## 2023-02-12 DIAGNOSIS — Z79899 Other long term (current) drug therapy: Secondary | ICD-10-CM | POA: Diagnosis not present

## 2023-02-12 DIAGNOSIS — F29 Unspecified psychosis not due to a substance or known physiological condition: Secondary | ICD-10-CM | POA: Insufficient documentation

## 2023-02-12 DIAGNOSIS — F17200 Nicotine dependence, unspecified, uncomplicated: Secondary | ICD-10-CM | POA: Insufficient documentation

## 2023-02-12 DIAGNOSIS — S6992XA Unspecified injury of left wrist, hand and finger(s), initial encounter: Secondary | ICD-10-CM | POA: Diagnosis present

## 2023-02-12 LAB — CBC WITH DIFFERENTIAL/PLATELET
Abs Immature Granulocytes: 0.01 10*3/uL (ref 0.00–0.07)
Basophils Absolute: 0 10*3/uL (ref 0.0–0.1)
Basophils Relative: 0 %
Eosinophils Absolute: 0.1 10*3/uL (ref 0.0–0.5)
Eosinophils Relative: 1 %
HCT: 39.5 % (ref 39.0–52.0)
Hemoglobin: 13.5 g/dL (ref 13.0–17.0)
Immature Granulocytes: 0 %
Lymphocytes Relative: 36 %
Lymphs Abs: 2.6 10*3/uL (ref 0.7–4.0)
MCH: 31.3 pg (ref 26.0–34.0)
MCHC: 34.2 g/dL (ref 30.0–36.0)
MCV: 91.4 fL (ref 80.0–100.0)
Monocytes Absolute: 0.4 10*3/uL (ref 0.1–1.0)
Monocytes Relative: 6 %
Neutro Abs: 4.1 10*3/uL (ref 1.7–7.7)
Neutrophils Relative %: 57 %
Platelets: 235 10*3/uL (ref 150–400)
RBC: 4.32 MIL/uL (ref 4.22–5.81)
RDW: 12.3 % (ref 11.5–15.5)
WBC: 7.2 10*3/uL (ref 4.0–10.5)
nRBC: 0 % (ref 0.0–0.2)

## 2023-02-12 LAB — COMPREHENSIVE METABOLIC PANEL
ALT: 38 U/L (ref 0–44)
AST: 27 U/L (ref 15–41)
Albumin: 4 g/dL (ref 3.5–5.0)
Alkaline Phosphatase: 50 U/L (ref 38–126)
Anion gap: 5 (ref 5–15)
BUN: 9 mg/dL (ref 6–20)
CO2: 25 mmol/L (ref 22–32)
Calcium: 8.8 mg/dL — ABNORMAL LOW (ref 8.9–10.3)
Chloride: 109 mmol/L (ref 98–111)
Creatinine, Ser: 0.92 mg/dL (ref 0.61–1.24)
GFR, Estimated: >60 mL/min (ref >60)
Glucose, Bld: 97 mg/dL (ref 70–99)
Potassium: 3.3 mmol/L — ABNORMAL LOW (ref 3.5–5.1)
Sodium: 139 mmol/L (ref 135–145)
Total Bilirubin: 0.4 mg/dL (ref 0.3–1.2)
Total Protein: 7.1 g/dL (ref 6.5–8.1)

## 2023-02-12 MED ORDER — SERTRALINE HCL 50 MG PO TABS
25.0000 mg | ORAL_TABLET | Freq: Every day | ORAL | Status: DC
  Administered 2023-02-12 – 2023-02-13 (×2): 25 mg via ORAL
  Filled 2023-02-12 (×2): qty 1

## 2023-02-12 MED ORDER — CARBAMAZEPINE ER 100 MG PO TB12
300.0000 mg | ORAL_TABLET | Freq: Two times a day (BID) | ORAL | Status: DC
  Administered 2023-02-12 – 2023-02-13 (×2): 300 mg via ORAL
  Filled 2023-02-12 (×2): qty 3

## 2023-02-12 MED ORDER — RISPERIDONE 1 MG PO TBDP
4.0000 mg | ORAL_TABLET | Freq: Every day | ORAL | Status: DC
  Administered 2023-02-12: 4 mg via ORAL
  Filled 2023-02-12 (×2): qty 4

## 2023-02-12 MED ORDER — HYDROXYZINE HCL 25 MG PO TABS
100.0000 mg | ORAL_TABLET | Freq: Every day | ORAL | Status: DC
  Administered 2023-02-12: 100 mg via ORAL
  Filled 2023-02-12: qty 4

## 2023-02-12 MED ORDER — VITAMIN D 25 MCG (1000 UNIT) PO TABS
1000.0000 [IU] | ORAL_TABLET | Freq: Every day | ORAL | Status: DC
  Administered 2023-02-12 – 2023-02-13 (×2): 1000 [IU] via ORAL
  Filled 2023-02-12 (×2): qty 1

## 2023-02-12 NOTE — ED Notes (Signed)
Pt dressed out into [place] scrubs and wanded by [employer].

## 2023-02-12 NOTE — ED Notes (Signed)
Tele psych at bedside for TTS

## 2023-02-12 NOTE — ED Notes (Addendum)
Patient belongings have been collected and put in two labeled patient bags. Security has been called and patient has been wanded. Pt has been transferred to room 35 in [facility] and belongins have been placed in locker 35.

## 2023-02-12 NOTE — BH Assessment (Signed)
Comprehensive Clinical Assessment (CCA) Note  02/12/2023 Geoffrey [name] [id number]  Disposition: Per Rockney Ghee, NP, patient will remain in the ED under observation until collateral information can be obtained   The patient demonstrates the following risk factors for suicide: Chronic risk factors for suicide include: psychiatric disorder of autism and ADHD and substance use disorder. Acute risk factors for suicide include: family or marital conflict, unemployment, and social withdrawal/isolation. Protective factors for this patient include: hope for the future. Considering these factors, the overall suicide risk at this point appears to be moderate. Patient is not appropriate for outpatient follow up.    PHQ2-9    Flowsheet Row ED from 02/12/2023 in [facility] Emergency Department at [facility] Office Visit from 12/21/2012 in [facility]  PHQ-2 Total Score 0 0  PHQ-9 Total Score 6 6      Flowsheet Row ED from 07/02/2022 in [facility] at [facility]) ED from 05/01/2021 in [facility] at [facility]) ED from 05/01/2019 in [facility] Emergency Department at [facility]  C-SSRS RISK CATEGORY No Risk Error: Question 6 not populated No Risk         Chief Complaint:  Chief Complaint  Patient presents with   IVC   Aggressive Behavior   Autism   ADHD   Visit Diagnosis: F84.0 Autism and F90.9 ADHD    CCA Screening, Triage and Referral (STR)  Patient Reported Information How did you hear about [place]? Legal System  What Is the Reason for Your Visit/Call Today? Patient was brought to the [facility] via police for IVC.  Patient was not cooperative with the ED staff upon arrival, but was more cooperative with this Clinical research associate.  Patient states that he and his mother got into an argument over a cell phone.  He states that his mother is his guardian and she had taken the phone from him and he states that she  was supposed to return it to him today, but he states that she refused.  Patient states, "the phone is not even on, all I can do is WIFI calling."  Patient states that he got really angry with his mother and he states that he punched the outside brick wall of his home and injured his knuckles. Patient is very limited with answering questions.  Researching his chart indicates that he is diagnosed with autism and ADHD.  When he was younger, he also had a conduct this order.  Patient has a long history of aggressive and threatening behaviors towards his family and it is highly likely that if the police were involved tonight, they must have been scared he would hurt them.  Patient denies that he threatened his family.  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 Patient states that he has never attempted suicide and he denies HI.  Patient denied experiencing any history of psychosis. TTS attempted to contact patient's mother/guardian Kathleen Argue at [phone] for collateral information, but it went to voice mail.  A HIPPA compliant voicemail was left requesting a return phone call. Patient denies any sleep disturbance and states, "I was asleep when the police came and picked me up."  Patient states that his appetite is good.  He denies any history of self-mutilating behaviors or any history of abuse.  He denies having access to weapons.  Patient states that he lives with his mother and his stepfather.  He  states that he is the second oldest of five children.  He states that he has never been married and he states that he has no children. He states that he has a twelfth grade education.  He denies any current legal issues and denies having access to weapons  Patient is alert and oriented. His judgment, insight and impulse control are impaired. He denies SI/HI and denies psychosis. His thoughts are organized and his memory appears to be intact.  His  speech is coherent and normal in tone and rate and his eye contact is good. How Long Has This Been Causing You Problems? > than 6 months  What Do You Feel Would Help You the Most Today? Treatment for Depression or other mood problem   Have You Recently Had Any Thoughts About Hurting Yourself? No  Are You Planning to Commit Suicide/Harm Yourself At This time? No   Flowsheet Row ED from 07/02/2022 in [facility] Urgent Care at [facility] ED from 05/01/2021 in [facility] Ur[facility] Care at [facility]) ED from 05/01/2019 in [facility] Emergency Department at [facility]  C-SSRS RISK CATEGORY No Risk Error: Question 6 not populated No Risk       Have you Recently Had Thoughts About Hurting Someone Karolee Ohs? No  Are You Planning to Harm Someone at This Time? No  Explanation: NA   Have You Used Any Alcohol or Drugs in the Past 24 Hours? No  What Did You Use and How Much? Patient denies use and his UDS has not returned from lab at the time of assessment   Do You Currently Have a Therapist/Psychiatrist? No  Name of Therapist/Psychiatrist: Name of Therapist/Psychiatrist: Patient states that he is not currently in mental health treatment. He was last hospitalized at [facility] in 04/2019   Have You Been Recently Discharged From Any Office Practice or Programs? No  Explanation of Discharge From Practice/Program: NA     CCA Screening Triage Referral Assessment Type of Contact: Tele-Assessment  Telemedicine Service Delivery:   Is this Initial or Reassessment? Is this Initial or Reassessment?: Initial Assessment  Date Telepsych consult ordered in [facility]:  Date Telepsych consult ordered in [facility]: 02/12/23  Time Telepsych consult ordered in [facility]:  Time Telepsych consult ordered in [facility]: 0302  Location of Assessment: [facility] ED  Provider Location: [facility] Assessment Services   Collateral Involvement: attempted to contact mother/guardian Kathleen Argue, went to  voice mail and a return call was requested   Does Patient Have a Court Appointed Legal Guardian? Yes Mother  Legal Guardian Contact Information: Kathleen Argue (mother)  [phone]  Copy of Legal Guardianship Form: -- (unknown)  Legal Guardian Notified of Arrival: -- (left message)  Legal Guardian Notified of Pending Discharge: -- (NA)  If Minor and Not Living with Parent(s), Who has Custody? NA  Is CPS involved or ever been involved? Never  Is APS involved or ever been involved? Never   Patient Determined To Be At Risk for Harm To Self or Others Based on Review of Patient Reported Information or Presenting Complaint? No  Method: No Plan  Availability of Means: No access or NA  Intent: Vague intent or NA  Notification Required: No need or identified person  Additional Information for Danger to Others Potential: -- (none reported)  Additional Comments for Danger to Others Potential: patient has a history of threatening behaviors toward his family  Are There Guns or Other Weapons in Your Home? No  Types of Guns/Weapons: none reported  Are These Weapons Safely Secured?                            No  Who Could Verify You Are Able To Have These Secured: none reported  Do You Have any Outstanding Charges, Pending Court Dates, Parole/Probation? none reported  Contacted To Inform of Risk of Harm To Self or Others: Other: Comment (patient denies thoughts of hurting others)    Does Patient Present under Involuntary Commitment? No (police evidently requesting hospital to initiate IVC)    [place] of Residence: [place]   Patient Currently Receiving the Following Services: Not Receiving Services   Determination of Need: Emergent (2 hours)   Options For Referral: Inpatient Hospitalization; Medication Management; Partial Hospitalization; Intensive Outpatient Therapy     CCA Biopsychosocial Patient Reported Schizophrenia/Schizoaffective Diagnosis in Past:  No   Strengths: none identified   Mental Health Symptoms Depression:   None (none reported)   Duration of Depressive symptoms:    Mania:   None   Anxiety:    None   Psychosis:   None   Duration of Psychotic symptoms:    Trauma:   None   Obsessions:   None   Compulsions:   None   Inattention:   None   Hyperactivity/Impulsivity:   None   Oppositional/Defiant Behaviors:   Argumentative; Defies rules; Easily annoyed; Temper   Emotional Irregularity:   Intense/inappropriate anger; Intense/unstable relationships; Potentially harmful impulsivity; Transient, stress-related paranoia/disassociation; Recurrent suicidal behaviors/gestures/threats   Other Mood/Personality Symptoms:   depressed mood, uncooperative    Mental Status Exam Appearance and self-care  Stature:   Tall   Weight:   Average weight   Clothing:   Disheveled   Grooming:   Neglected   Cosmetic use:   None   Posture/gait:   Normal   Motor activity:   Agitated   Sensorium  Attention:   Normal   Concentration:   Normal   Orientation:   Object; Person; Place; Situation; Time   Recall/memory:   Normal   Affect and Mood  Affect:   Depressed; Flat   Mood:   Depressed   Relating  Eye contact:   Avoided   Facial expression:   Depressed   Attitude toward examiner:   Cooperative   Thought and Language  Speech flow:  Clear and Coherent   Thought content:   Appropriate to Mood and Circumstances   Preoccupation:   None   Hallucinations:   None   Organization:   Coherent   [employer] of Knowledge:   Average   Intelligence:   Average   Abstraction:   Normal   Judgement:   Impaired   Reality Testing:   Distorted   Insight:   Poor   Decision Making:   Impulsive   Social Functioning  Social Maturity:   Isolates   Social Judgement:   Normal   Stress  Stressors:   Family conflict   Coping Ability:   Overwhelmed   Skill  Deficits:   Decision making; Intellect/education   Supports:   Support needed     Religion: Religion/Spirituality Are You A Religious Person?: No (not assessed) How Might This Affect Treatment?: NA  Leisure/Recreation: Leisure / Recreation Do You Have Hobbies?: No  Exercise/Diet: Exercise/Diet Do You Exercise?: No Have You Gained or Lost A Significant Amount of Weight in the Past Six Months?: No Do You Follow a Special Diet?: No Do You Have Any Trouble Sleeping?:  No   CCA Employment/Education Employment/Work Situation: Employment / Work Systems developer: Unemployed Has Patient ever Been in Equities trader?: No  Education: Education Is Patient Currently Attending School?: No Last Grade Completed: 12 Did You Product manager?: No Did You Have An Individualized Education Program (IIEP): No Did You Have Any Difficulty At [employer]?: No Patient's Education Has Been Impacted by Current Illness: No   CCA Family/Childhood History Family and Relationship History: Family history Marital status: Single Does patient have children?: No  Childhood History:  Childhood History By whom was/is the patient raised?: Mother/father and step-parent Did patient suffer any verbal/emotional/physical/sexual abuse as a child?: No Did patient suffer from severe childhood neglect?: No Has patient ever been sexually abused/assaulted/raped as an adolescent or adult?: No Was the patient ever a victim of a crime or a disaster?: No Witnessed domestic violence?: No Has patient been affected by domestic violence as an adult?: No       CCA Substance Use Alcohol/Drug Use: Alcohol / Drug Use Pain Medications: See MAR Prescriptions: See MAR Over the Counter: See MAR History of alcohol / drug use?: Yes Longest period of sobriety (when/how long): Unknown Negative Consequences of Use: Personal relationships Withdrawal Symptoms: None Substance #1 Name of Substance 1: marijuana 1 -  Age of First Use: Chart shows a history of marijuana use, but patient denies use, patient is not very cooperative 1 - Amount (size/oz): unable to assess 1 - Frequency: unable to assess 1 - Duration: unable to assess 1 - Last Use / Amount: unable to assess 1 - Method of Aquiring: unable to assess 1- Route of Use: smoke Substance #2 Name of Substance 2: amphetamines, Chart shows a history of amphetamine use, but patient denies use, patient is not very cooperative 2 - Age of First Use: unable to assess 2 - Amount (size/oz): unable to assess 2 - Frequency: unable to assess 2 - Duration: unable to assess 2 - Last Use / Amount: unable to assess 2 - Method of Aquiring: unable to assess 2 - Route of Substance Use: unable to assess                     ASAM's:  Six Dimensions of Multidimensional Assessment  Dimension 1:  Acute Intoxication and/or Withdrawal Potential:   Dimension 1:  Description of individual's past and current experiences of substance use and withdrawal: Patient denies any current withdrawal symptoms  Dimension 2:  Biomedical Conditions and Complications:   Dimension 2:  Description of patient's biomedical conditions and  complications: Patient denies any current medical issues  Dimension 3:  Emotional, Behavioral, or Cognitive Conditions and Complications:  Dimension 3:  Description of emotional, behavioral, or cognitive conditions and complications: Patient is diagnosed with Autism and ADHD  Dimension 4:  Readiness to Change:  Dimension 4:  Description of Readiness to Change criteria: Patient is resistant to chanfe, has very little insight  Dimension 5:  Relapse, Continued use, or Continued Problem Potential:  Dimension 5:  Relapse, continued use, or continued problem potential critiera description: Patient lacks adequate coping strategies to prevent relapse  Dimension 6:  Recovery/Living Environment:  Dimension 6:  Recovery/Iiving environment criteria description:  Patient states that there is a lot of conflict in his home.  He states that he does not communicate well with his family  ASAM Severity Score: ASAM's Severity Rating Score: 11  ASAM Recommended Level of Treatment: ASAM Recommended Level of Treatment: Level II Intensive Outpatient Treatment   Substance use Disorder (  SUD) Substance Use Disorder (SUD)  Checklist Symptoms of Substance Use: Social, occupational, recreational activities given up or reduced due to use, Recurrent use that results in a failure to fulfill major role obligations (work, school, home), Continued use despite persistent or recurrent social, interpersonal problems, caused or exacerbated by use, Continued use despite having a persistent/recurrent physical/psychological problem caused/exacerbated by use  Recommendations for Services/Supports/Treatments: Recommendations for Services/Supports/Treatments Recommendations For Services/Supports/Treatments: CD-IOP Intensive Chemical Dependency Program  Discharge Disposition:    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   Involuntary commitment    Aggression 10/02/2014   Autism 10/02/2014   Aggressive behavior    Oppositional defiant disorder 05/20/2013   Post traumatic stress disorder (PTSD) 05/20/2013   Excessive anger 05/20/2013   ADD (attention deficit disorder) 05/20/2013   Vitamin D insufficiency 12/30/2012   Loss of weight 12/30/2012   Abnormal weight loss 12/21/2012   Intellectual disability 11/03/2012   ADHD (attention deficit hyperactivity disorder), 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   Sleep disorder 11/03/2012   Language disorder involving understanding and expression of language 11/03/2012   Environmental allergies 11/03/2012   Delay in development    Other convulsions 10/05/2012    Essential and other specified forms of tremor 10/05/2012   Unspecified mental or behavioral problem 10/05/2012   Undersocialized conduct disorder, aggressive type, unspecified 10/05/2012     Referrals to Alternative Service(s): Referred to Alternative Service(s):   Place:   Date:   Time:    Referred to Alternative Service(s):   Place:   Date:   Time:    Referred to Alternative Service(s):   Place:   Date:   Time:    Referred to Alternative Service(s):   Place:   Date:   Time:     Keiley Levey J Jaqua Ching, LCAS
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
## 2023-02-13 DIAGNOSIS — R4689 Other symptoms and signs involving appearance and behavior: Secondary | ICD-10-CM

## 2023-02-13 DIAGNOSIS — S60052A Contusion of left little finger without damage to nail, initial encounter: Secondary | ICD-10-CM | POA: Diagnosis not present

## 2023-02-13 LAB — RAPID URINE DRUG SCREEN, HOSP PERFORMED
Amphetamines: NOT DETECTED
Barbiturates: NOT DETECTED
Benzodiazepines: NOT DETECTED
Cocaine: NOT DETECTED
Opiates: NOT DETECTED
Tetrahydrocannabinol: POSITIVE — AB

## 2023-02-13 MED ORDER — DIVALPROEX SODIUM 125 MG PO CSDR: 500.0000 mg | DELAYED_RELEASE_CAPSULE | Freq: Every day | ORAL | 0 refills | Status: DC

## 2023-02-13 MED ORDER — SERTRALINE HCL 20 MG/ML PO CONC: 25.0000 mg | Freq: Every day | ORAL | 0 refills | Status: DC

## 2023-02-13 MED ORDER — DIVALPROEX SODIUM 125 MG PO CSDR: 250.0000 mg | DELAYED_RELEASE_CAPSULE | Freq: Every day | ORAL | 0 refills | Status: DC

## 2023-02-13 MED ORDER — RISPERIDONE 4 MG PO TBDP
4.0000 mg | ORAL_TABLET | Freq: Every day | ORAL | 0 refills | Status: DC
Start: 2023-02-13 — End: 2023-06-21

## 2023-02-13 MED ORDER — SERTRALINE HCL 25 MG PO TABS: 25.0000 mg | ORAL_TABLET | Freq: Every day | ORAL | 0 refills | Status: DC

## 2023-02-13 MED ORDER — CARBAMAZEPINE ER 100 MG PO TB12: 300.0000 mg | ORAL_TABLET | Freq: Two times a day (BID) | ORAL | 0 refills | Status: DC

## 2023-02-13 MED ORDER — HYDROXYZINE HCL 50 MG PO TABS
100.0000 mg | ORAL_TABLET | Freq: Every day | ORAL | 0 refills | Status: AC
Start: 2023-02-13 — End: ?

## 2023-02-13 NOTE — ED Notes (Signed)
Gave pt decaf cup of coffee.

## 2023-02-13 NOTE — Progress Notes (Signed)
Per Alona Bene, PMHNP pt has been psych cleared. This CSW will now remove pt from the [facility] shift report. TOC to assist if there is an identified discharge need.    Maryjean Ka, MSW, [facility] 02/13/2023 6:50 PM

## 2023-02-13 NOTE — Discharge Instructions (Addendum)
  Discharge recommendations:  Patient is to take medications as prescribed. Please see information for follow-up appointment with psychiatry and therapy. Please follow up with your primary care provider for all medical related needs.   Therapy: We recommend that patient participate in individual therapy to address mental health concerns.  Medications: The patient or guardian is to contact a medical professional and/or outpatient provider to address any new side effects that develop. The patient or guardian should update outpatient providers of any new medications and/or medication changes.   Atypical antipsychotics: If you are prescribed an atypical antipsychotic, it is recommended that your height, weight, BMI, blood pressure, fasting lipid panel, and fasting blood sugar be monitored by your outpatient providers.  Safety:  The patient should abstain from use of illicit substances/drugs and abuse of any medications. If symptoms worsen or do not continue to improve or if the patient becomes actively suicidal or homicidal then it is recommended that the patient return to the closest hospital emergency department, the [facility], or call 911 for further evaluation and treatment. National Suicide Prevention Lifeline 1-800-SUICIDE or 670-730-8088.  About 988 988 offers 24/7 access to trained crisis counselors who can help people experiencing mental health-related distress. People can call or text 988 or chat 988lifeline.org for themselves or if they are worried about a loved one who may need crisis support.  Crisis Mobile: [facility]:                     (780)224-2435 (for crisis response 24 hours a day) [facility] Hotline:                                            219-882-2963    Safety Plan Geoffrey [name] will reach out to his mother Geoffrey [name], call 911 or call mobile crisis, or go to nearest emergency room if condition worsens or if  suicidal thoughts become active Patients' will follow up with behavioral health urgent care for outpatient psychiatric services (therapy/medication management).  The suicide prevention education provided includes the following: Suicide risk factors Suicide prevention and interventions National Suicide Hotline telephone number [facility] assessment telephone number [facility] Emergency Assistance 911 [facility] and/or Residential Mobile Crisis Unit telephone number Request made of family/significant other to:  mother Geoffrey [name] weapons (e.g., guns, rifles, knives), all items previously/currently identified as safety concern.   Remove drugs/medications (over the counter, prescriptions, illicit drugs), all items previously/currently identified as a safety concern.

## 2023-02-13 NOTE — ED Provider Notes (Addendum)
Emergency Medicine Observation Re-evaluation Note  Geoffrey [name] is a 27 y.o. male, seen on rounds today.  Pt initially presented to the ED for complaints of IVC, Aggressive Behavior, Autism, and ADHD Currently, the patient is resting comfortably.  Physical Exam  BP (!) 108/55 (BP Location: Right Arm)   Pulse (!) 55   Temp (!) 97.5 F (36.4 C) (Oral)   Resp 16   SpO2 100%  Physical Exam General: NAD Lungs: bilateral chest rise Psych: resting comfortably  ED Course / MDM  EKG:   I have reviewed the labs performed to date as well as medications administered while in observation.  Recent changes in the last 24 hours include patient arrived under IVC.  Plan for reassessment by psych this morning..  Plan  Current plan is for psych reassessment this morning.  Psychiatry reassessed the patient and feel that he safe for discharge.   Melene Plan, DO 02/13/23 [phone]

## 2023-02-13 NOTE — ED Notes (Signed)
Gave pt cup for urine for labs
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
## 2023-02-13 NOTE — ED Notes (Signed)
Pt at window asking for something to wrap his hand.  I will check to see what he had.  Pt currently talking with other pts.

## 2023-02-13 NOTE — ED Notes (Signed)
Pt given lunch tray.

## 2023-02-13 NOTE — ED Notes (Signed)
Gave pt graham crackers. 

## 2023-02-13 NOTE — ED Notes (Signed)
Pt has 2 - 1 cm cuts on top of left hand.  I placed gauze over cuts and wrapped hand with coban.

## 2023-06-20 ENCOUNTER — Ambulatory Visit (HOSPITAL_COMMUNITY)
Admission: EM | Admit: 2023-06-20 | Discharge: 2023-06-21 | Disposition: A | Discharge status: Home / Self Care | Payer: 59 | Attending: Urology | Admitting: Urology

## 2023-06-20 DIAGNOSIS — F919 Conduct disorder, unspecified: Secondary | ICD-10-CM | POA: Insufficient documentation

## 2023-06-20 DIAGNOSIS — Z91148 Patient's other noncompliance with medication regimen for other reason: Secondary | ICD-10-CM | POA: Insufficient documentation

## 2023-06-20 DIAGNOSIS — F840 Autistic disorder: Secondary | ICD-10-CM | POA: Diagnosis not present

## 2023-06-20 DIAGNOSIS — R4689 Other symptoms and signs involving appearance and behavior: Secondary | ICD-10-CM | POA: Diagnosis present

## 2023-06-20 DIAGNOSIS — R45850 Homicidal ideations: Secondary | ICD-10-CM | POA: Diagnosis present

## 2023-06-20 MED ORDER — HYDROXYZINE HCL 25 MG PO TABS: 25.0000 mg | ORAL_TABLET | Freq: Three times a day (TID) | ORAL | Status: DC | PRN

## 2023-06-20 MED ORDER — TRAZODONE HCL 50 MG PO TABS
50.0000 mg | ORAL_TABLET | Freq: Every evening | ORAL | Status: DC | PRN
Start: 2023-06-20 — End: 2023-06-21

## 2023-06-20 MED ORDER — LORAZEPAM 1 MG PO TABS: 1.0000 mg | ORAL_TABLET | ORAL | Status: DC | PRN

## 2023-06-20 MED ORDER — ZIPRASIDONE MESYLATE 20 MG IM SOLR: 20.0000 mg | Freq: Two times a day (BID) | INTRAMUSCULAR | Status: DC | PRN

## 2023-06-20 MED ORDER — MAGNESIUM HYDROXIDE 400 MG/5ML PO SUSP: 30.0000 mL | Freq: Every day | ORAL | Status: DC | PRN

## 2023-06-20 MED ORDER — ACETAMINOPHEN 325 MG PO TABS: 650.0000 mg | ORAL_TABLET | Freq: Four times a day (QID) | ORAL | Status: DC | PRN

## 2023-06-20 MED ORDER — ALUM & MAG HYDROXIDE-SIMETH 200-200-20 MG/5ML PO SUSP: 30.0000 mL | ORAL | Status: DC | PRN
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
## 2023-06-20 NOTE — ED Notes (Signed)
Pt is showering  ?
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
## 2023-06-21 DIAGNOSIS — F840 Autistic disorder: Secondary | ICD-10-CM | POA: Diagnosis not present

## 2023-06-21 DIAGNOSIS — F919 Conduct disorder, unspecified: Secondary | ICD-10-CM | POA: Diagnosis not present

## 2023-06-21 LAB — COMPREHENSIVE METABOLIC PANEL
ALT: 47 U/L — ABNORMAL HIGH (ref 0–44)
AST: 32 U/L (ref 15–41)
Albumin: 4 g/dL (ref 3.5–5.0)
Alkaline Phosphatase: 41 U/L (ref 38–126)
Anion gap: 12 (ref 5–15)
BUN: 12 mg/dL (ref 6–20)
CO2: 22 mmol/L (ref 22–32)
Calcium: 9.4 mg/dL (ref 8.9–10.3)
Chloride: 102 mmol/L (ref 98–111)
Creatinine, Ser: 0.9 mg/dL (ref 0.61–1.24)
GFR, Estimated: >60 mL/min (ref >60)
Glucose, Bld: 69 mg/dL — ABNORMAL LOW (ref 70–99)
Potassium: 4.2 mmol/L (ref 3.5–5.1)
Sodium: 136 mmol/L (ref 135–145)
Total Bilirubin: 0.9 mg/dL (ref <1.2)
Total Protein: 7 g/dL (ref 6.5–8.1)

## 2023-06-21 LAB — LIPID PANEL
Cholesterol: 147 mg/dL (ref 0–200)
HDL: 50 mg/dL (ref >40)
LDL Cholesterol: 91 mg/dL (ref 0–99)
Total CHOL/HDL Ratio: 2.9 {ratio}
Triglycerides: 29 mg/dL (ref <150)
VLDL: 6 mg/dL (ref 0–40)

## 2023-06-21 LAB — POCT URINE DRUG SCREEN - MANUAL ENTRY (I-SCREEN)
POC Amphetamine UR: NOT DETECTED
POC Buprenorphine (BUP): NOT DETECTED
POC Cocaine UR: NOT DETECTED
POC Marijuana UR: POSITIVE — AB
POC Methadone UR: NOT DETECTED
POC Methamphetamine UR: NOT DETECTED
POC Morphine: NOT DETECTED
POC Oxazepam (BZO): NOT DETECTED
POC Oxycodone UR: NOT DETECTED
POC Secobarbital (BAR): NOT DETECTED

## 2023-06-21 LAB — CBC WITH DIFFERENTIAL/PLATELET
Abs Immature Granulocytes: 0.01 10*3/uL (ref 0.00–0.07)
Basophils Absolute: 0 10*3/uL (ref 0.0–0.1)
Basophils Relative: 1 %
Eosinophils Absolute: 0 10*3/uL (ref 0.0–0.5)
Eosinophils Relative: 0 %
HCT: 40.9 % (ref 39.0–52.0)
Hemoglobin: 13.9 g/dL (ref 13.0–17.0)
Immature Granulocytes: 0 %
Lymphocytes Relative: 33 %
Lymphs Abs: 2 10*3/uL (ref 0.7–4.0)
MCH: 30.8 pg (ref 26.0–34.0)
MCHC: 34 g/dL (ref 30.0–36.0)
MCV: 90.5 fL (ref 80.0–100.0)
Monocytes Absolute: 0.3 10*3/uL (ref 0.1–1.0)
Monocytes Relative: 5 %
Neutro Abs: 3.6 10*3/uL (ref 1.7–7.7)
Neutrophils Relative %: 61 %
Platelets: 212 10*3/uL (ref 150–400)
RBC: 4.52 MIL/uL (ref 4.22–5.81)
RDW: 13.1 % (ref 11.5–15.5)
WBC: 6 10*3/uL (ref 4.0–10.5)
nRBC: 0 % (ref 0.0–0.2)

## 2023-06-21 LAB — ETHANOL: Alcohol, Ethyl (B): <10 mg/dL (ref <10)

## 2023-06-21 LAB — TSH: TSH: 2.343 u[IU]/mL (ref 0.350–4.500)

## 2023-06-21 MED ORDER — OLANZAPINE 10 MG PO TBDP: 10.0000 mg | ORAL_TABLET | Freq: Three times a day (TID) | ORAL | Status: DC | PRN

## 2023-06-21 MED ORDER — LORAZEPAM 1 MG PO TABS: 1.0000 mg | ORAL_TABLET | ORAL | Status: DC | PRN

## 2023-06-21 MED ORDER — ZIPRASIDONE MESYLATE 20 MG IM SOLR: 20.0000 mg | INTRAMUSCULAR | Status: DC | PRN
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
## 2023-06-21 NOTE — ED Notes (Signed)
Pt is currently sleeping, no distress noted, environmental check complete, will continue to monitor patient for safety.  

## 2023-06-21 NOTE — Care Management (Signed)
Care Management   Writer contacted the [facility] Manager (Mrs. Rosezetta Schlatter) at [phone].  Mrs. Tith reports that she is working with the APS Case Worker and the QP with [facility] [employer] regarding who will be able to come and pick up the patient and where the patient will be placed.   Per Mrs. Tith, the patient is not able to return to the previous [facility] and he will need emergency placement.  The patient is not able to go with his legal guardian Osker Mason WIlliams/Mother) because she is homeless.   Writer informed the NP and the RN working with the patient.

## 2023-06-21 NOTE — ED Notes (Signed)
Patient d/c to home with all belongings with parent, legal guardian in private vehicle. The provider discussed anger management with the patient in the writers presence.

## 2023-06-21 NOTE — Discharge Instructions (Addendum)

## 2023-06-21 NOTE — ED Notes (Signed)
The patient is sitting in the recliner, watching television, and socializing with other pts. No acute distress noted. Environment is secured. Will continue to monitor for safety.

## 2023-06-21 NOTE — ED Notes (Signed)
Patient given supplies for shower

## 2023-06-21 NOTE — ED Notes (Signed)
Patient taken outside.

## 2023-06-21 NOTE — ED Notes (Signed)

## 2023-06-22 LAB — PROLACTIN: Prolactin: 7.1 ng/mL (ref 3.6–31.5)

## 2023-06-22 LAB — HEMOGLOBIN A1C
Hgb A1c MFr Bld: 5.2 % (ref 4.8–5.6)
Mean Plasma Glucose: 103 mg/dL

## 2024-03-13 ENCOUNTER — Ambulatory Visit: Admission: EM | Admit: 2024-03-13 | Discharge: 2024-03-13 | Disposition: A | Discharge status: Home / Self Care
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
